# Patient Record
Sex: Male | Born: 1970 | Race: White | Hispanic: No | Marital: Single | Smoking: Former smoker
Health system: Southern US, Community
[De-identification: ages and names within clinical notes are randomized; demographics above are authoritative.]

## PROBLEM LIST (undated history)

## (undated) DIAGNOSIS — R7989 Other specified abnormal findings of blood chemistry: Secondary | ICD-10-CM

## (undated) DIAGNOSIS — F419 Anxiety disorder, unspecified: Secondary | ICD-10-CM

---

## 2004-05-10 ENCOUNTER — Emergency Department (HOSPITAL_COMMUNITY): Admission: EM | Admit: 2004-05-10 | Discharge: 2004-05-10 | Payer: Self-pay | Admitting: Emergency Medicine

## 2010-02-16 HISTORY — PX: CLAVICLE SURGERY: SHX598

## 2010-09-10 ENCOUNTER — Ambulatory Visit (HOSPITAL_COMMUNITY)
Admission: RE | Admit: 2010-09-10 | Discharge: 2010-09-10 | Disposition: A | Discharge status: Home / Self Care | Payer: Self-pay | Source: Ambulatory Visit | Attending: Orthopedic Surgery | Admitting: Orthopedic Surgery

## 2010-09-10 ENCOUNTER — Ambulatory Visit (HOSPITAL_COMMUNITY): Payer: Self-pay

## 2010-09-10 DIAGNOSIS — K219 Gastro-esophageal reflux disease without esophagitis: Secondary | ICD-10-CM | POA: Insufficient documentation

## 2010-09-10 DIAGNOSIS — S42023A Displaced fracture of shaft of unspecified clavicle, initial encounter for closed fracture: Secondary | ICD-10-CM | POA: Insufficient documentation

## 2010-09-10 LAB — DIFFERENTIAL
Basophils Absolute: 0 10*3/uL (ref 0.0–0.1)
Basophils Relative: 0 % (ref 0–1)
Eosinophils Absolute: 0.2 10*3/uL (ref 0.0–0.7)
Eosinophils Relative: 3 % (ref 0–5)
Monocytes Absolute: 0.5 10*3/uL (ref 0.1–1.0)

## 2010-09-10 LAB — SURGICAL PCR SCREEN
MRSA, PCR: NEGATIVE
Staphylococcus aureus: NEGATIVE

## 2010-09-10 LAB — ABO/RH: ABO/RH(D): A NEG

## 2010-09-10 LAB — BASIC METABOLIC PANEL
BUN: 15 mg/dL (ref 6–23)
GFR calc non Af Amer: >60 mL/min (ref >60)
Glucose, Bld: 100 mg/dL — ABNORMAL HIGH (ref 70–99)
Potassium: 4.2 mEq/L (ref 3.5–5.1)

## 2010-09-10 LAB — URINALYSIS, ROUTINE W REFLEX MICROSCOPIC
Bilirubin Urine: NEGATIVE
Hgb urine dipstick: NEGATIVE
Protein, ur: NEGATIVE mg/dL
Specific Gravity, Urine: 1.024 (ref 1.005–1.030)
Urobilinogen, UA: 0.2 mg/dL (ref 0.0–1.0)

## 2010-09-10 LAB — CBC
MCHC: 35 g/dL (ref 30.0–36.0)
RDW: 13.1 % (ref 11.5–15.5)

## 2010-09-10 LAB — PROTIME-INR
INR: 0.92 (ref 0.00–1.49)
Prothrombin Time: 12.6 seconds (ref 11.6–15.2)

## 2010-09-10 LAB — TYPE AND SCREEN
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
## 2011-01-26 ENCOUNTER — Ambulatory Visit: Payer: Self-pay

## 2011-01-26 DIAGNOSIS — J029 Acute pharyngitis, unspecified: Secondary | ICD-10-CM

## 2011-05-19 ENCOUNTER — Ambulatory Visit (INDEPENDENT_AMBULATORY_CARE_PROVIDER_SITE_OTHER): Payer: BC Managed Care – PPO | Admitting: Family Medicine

## 2011-05-19 ENCOUNTER — Ambulatory Visit: Payer: BC Managed Care – PPO

## 2011-05-19 VITALS — BP 129/76 | HR 73 | Temp 98.5°F | Resp 16 | Ht 74.0 in | Wt 230.2 lb

## 2011-05-19 DIAGNOSIS — R109 Unspecified abdominal pain: Secondary | ICD-10-CM

## 2011-05-19 DIAGNOSIS — R8281 Pyuria: Secondary | ICD-10-CM

## 2011-05-19 DIAGNOSIS — R339 Retention of urine, unspecified: Secondary | ICD-10-CM

## 2011-05-19 DIAGNOSIS — N390 Urinary tract infection, site not specified: Secondary | ICD-10-CM

## 2011-05-19 DIAGNOSIS — K760 Fatty (change of) liver, not elsewhere classified: Secondary | ICD-10-CM | POA: Insufficient documentation

## 2011-05-19 LAB — POCT URINALYSIS DIPSTICK
Bilirubin, UA: NEGATIVE
Nitrite, UA: NEGATIVE
Protein, UA: NEGATIVE
Urobilinogen, UA: 0.2
pH, UA: 6

## 2011-05-19 LAB — POCT UA - MICROSCOPIC ONLY
Casts, Ur, LPF, POC: NEGATIVE
Yeast, UA: NEGATIVE

## 2011-05-19 MED ORDER — DOXYCYCLINE HYCLATE 100 MG PO TABS: 100.0000 mg | ORAL_TABLET | Freq: Two times a day (BID) | ORAL | Status: AC

## 2011-05-19 MED ORDER — METAXALONE 800 MG PO TABS: 800.0000 mg | ORAL_TABLET | Freq: Three times a day (TID) | ORAL | Status: AC

## 2011-05-19 MED ORDER — CEFTRIAXONE SODIUM 1 G IJ SOLR
250.0000 mg | INTRAMUSCULAR | Status: DC
  Administered 2011-05-19: 250 mg via INTRAMUSCULAR

## 2011-05-19 MED ORDER — NAPROXEN 500 MG PO TABS: 500.0000 mg | ORAL_TABLET | Freq: Two times a day (BID) | ORAL | Status: AC

## 2011-05-19 NOTE — Progress Notes (Signed)
  Subjective:    Patient ID: Patrick Oconnell, male    DOB: 1970-05-08, 41 y.o.   MRN: [id number]  HPI    Review of Systems     Objective:   Physical Exam   [facility] reading (PRIMARY) by  Dr. Milus Glazier as NAD.       Assessment & Plan:

## 2011-05-19 NOTE — Progress Notes (Signed)
  Subjective:    Patient ID: Patrick Oconnell, male    DOB: 03-Apr-1970, 41 y.o.   MRN: [id number]  HPI 41 yo CM c/o R flank and lower back pain.  NKI.  Thinks he may have had kidney problems as an infant, but unsure of what.  Usu takes aleve for pain, but it isn't helping at all.  No dysuria or frequency, but he does often feel as tho he has to urinate again shortly after urinating. 1 partner in the last 6 months, they broke up ~1 month ago.  Denies penile discharge.  Review of Systems  All other systems reviewed and are negative.       Objective:   Physical Exam  Nursing note and vitals reviewed. Constitutional: He is oriented to person, place, and time. He appears well-developed and well-nourished.  HENT:  Head: Normocephalic and atraumatic.  Neck: Normal range of motion. Neck supple.  Cardiovascular: Normal rate, regular rhythm and normal heart sounds.   Pulmonary/Chest: Effort normal and breath sounds normal.  Musculoskeletal: Normal range of motion. He exhibits no tenderness.       The area of pain is just overlying the location of the R kidney.  No rash, non tender w/ palpation. Neg SLR B sitting Gait and ambulation are normal.  Neurological: He is alert and oriented to person, place, and time. No cranial nerve deficit.       DTRs of B lower extremity with almost no response even with augmenting.  No babinski.  Skin: Skin is warm and dry.  Psychiatric: He has a normal mood and affect. His behavior is normal.   Results for orders placed in visit on 05/19/11  POCT URINALYSIS DIPSTICK      Component Value Range   Color, UA yellow     Clarity, UA clear     Glucose, UA neg     Bilirubin, UA neg     Ketones, UA neg     Spec Grav, UA 1.025     Blood, UA neg     pH, UA 6.0     Protein, UA neg     Urobilinogen, UA 0.2     Nitrite, UA neg     Leukocytes, UA small (1+)    POCT UA - MICROSCOPIC ONLY      Component Value Range   WBC, Ur, HPF, POC 10-15     RBC, urine,  microscopic neg     Bacteria, U Microscopic 1+     Mucus, UA neg     Epithelial cells, urine per micros 0-3     Crystals, Ur, HPF, POC neg     Casts, Ur, LPF, POC neg     Yeast, UA neg         Assessment & Plan:  Pyuria-Cover for GC/chlamydia. Culture and uriprobe sent. Flank pain-Naprosyn and skelaxin.  Increase water intake. Consider test of cure if + for STD

## 2011-05-20 LAB — GC/CHLAMYDIA PROBE AMP, URINE
Chlamydia, Swab/Urine, PCR: NEGATIVE
GC Probe Amp, Urine: NEGATIVE

## 2011-05-21 LAB — URINE CULTURE: Colony Count: NO GROWTH

## 2011-07-08 ENCOUNTER — Ambulatory Visit: Payer: BC Managed Care – PPO

## 2014-01-28 ENCOUNTER — Emergency Department (HOSPITAL_COMMUNITY): Payer: BC Managed Care – PPO

## 2014-01-28 ENCOUNTER — Encounter (HOSPITAL_COMMUNITY): Payer: Self-pay

## 2014-01-28 ENCOUNTER — Emergency Department (HOSPITAL_COMMUNITY)
Admission: EM | Admit: 2014-01-28 | Discharge: 2014-01-28 | Disposition: A | Discharge status: Home / Self Care | Payer: BC Managed Care – PPO | Attending: Emergency Medicine | Admitting: Emergency Medicine

## 2014-01-28 DIAGNOSIS — S93402A Sprain of unspecified ligament of left ankle, initial encounter: Secondary | ICD-10-CM

## 2014-01-28 DIAGNOSIS — R52 Pain, unspecified: Secondary | ICD-10-CM

## 2014-01-28 DIAGNOSIS — S30811A Abrasion of abdominal wall, initial encounter: Secondary | ICD-10-CM | POA: Diagnosis not present

## 2014-01-28 DIAGNOSIS — Z87891 Personal history of nicotine dependence: Secondary | ICD-10-CM | POA: Diagnosis not present

## 2014-01-28 DIAGNOSIS — Y92410 Unspecified street and highway as the place of occurrence of the external cause: Secondary | ICD-10-CM | POA: Insufficient documentation

## 2014-01-28 DIAGNOSIS — S42032A Displaced fracture of lateral end of left clavicle, initial encounter for closed fracture: Secondary | ICD-10-CM | POA: Diagnosis not present

## 2014-01-28 DIAGNOSIS — T148XXA Other injury of unspecified body region, initial encounter: Secondary | ICD-10-CM

## 2014-01-28 DIAGNOSIS — S4992XA Unspecified injury of left shoulder and upper arm, initial encounter: Secondary | ICD-10-CM | POA: Diagnosis present

## 2014-01-28 DIAGNOSIS — S80212A Abrasion, left knee, initial encounter: Secondary | ICD-10-CM | POA: Insufficient documentation

## 2014-01-28 DIAGNOSIS — S90512A Abrasion, left ankle, initial encounter: Secondary | ICD-10-CM | POA: Diagnosis not present

## 2014-01-28 DIAGNOSIS — Y9389 Activity, other specified: Secondary | ICD-10-CM | POA: Diagnosis not present

## 2014-01-28 DIAGNOSIS — Y998 Other external cause status: Secondary | ICD-10-CM | POA: Insufficient documentation

## 2014-01-28 DIAGNOSIS — S42002A Fracture of unspecified part of left clavicle, initial encounter for closed fracture: Secondary | ICD-10-CM

## 2014-01-28 DIAGNOSIS — S2232XA Fracture of one rib, left side, initial encounter for closed fracture: Secondary | ICD-10-CM | POA: Diagnosis not present

## 2014-01-28 MED ORDER — HYDROCODONE-ACETAMINOPHEN 5-325 MG PO TABS
1.0000 | ORAL_TABLET | Freq: Once | ORAL | Status: AC
  Administered 2014-01-28: 1 via ORAL
  Filled 2014-01-28: qty 1

## 2014-01-28 MED ORDER — HYDROCODONE-ACETAMINOPHEN 5-325 MG PO TABS: 1.0000 | ORAL_TABLET | Freq: Four times a day (QID) | ORAL | Status: DC | PRN
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
## 2014-01-28 NOTE — ED Notes (Signed)
Pt p

## 2014-01-28 NOTE — Discharge Instructions (Signed)
Abrasion An abrasion is a cut or scrape of the skin. Abrasions do not extend through all layers of the skin and most heal within 10 days. It is important to care for your abrasion properly to prevent infection. CAUSES  Most abrasions are caused by falling on, or gliding across, the ground or other surface. When your skin rubs on something, the outer and inner layer of skin rubs off, causing an abrasion. DIAGNOSIS  Your caregiver will be able to diagnose an abrasion during a physical exam.  TREATMENT  Your treatment depends on how large and deep the abrasion is. Generally, your abrasion will be cleaned with water and a mild soap to remove any dirt or debris. An antibiotic ointment may be put over the abrasion to prevent an infection. A bandage (dressing) may be wrapped around the abrasion to keep it from getting dirty.  You may need a tetanus shot if:  You cannot remember when you had your last tetanus shot.  You have never had a tetanus shot.  The injury broke your skin. If you get a tetanus shot, your arm may swell, get red, and feel warm to the touch. This is common and not a problem. If you need a tetanus shot and you choose not to have one, there is a rare chance of getting tetanus. Sickness from tetanus can be serious.  HOME CARE INSTRUCTIONS   If a dressing was applied, change it at least once a day or as directed by your caregiver. If the bandage sticks, soak it off with warm water.   Wash the area with water and a mild soap to remove all the ointment 2 times a day. Rinse off the soap and pat the area dry with a clean towel.   Reapply any ointment as directed by your caregiver. This will help prevent infection and keep the bandage from sticking. Use gauze over the wound and under the dressing to help keep the bandage from sticking.   Change your dressing right away if it becomes wet or dirty.   Only take over-the-counter or prescription medicines for pain, discomfort, or fever as  directed by your caregiver.   Follow up with your caregiver within 24-48 hours for a wound check, or as directed. If you were not given a wound-check appointment, look closely at your abrasion for redness, swelling, or pus. These are signs of infection. SEEK IMMEDIATE MEDICAL CARE IF:   You have increasing pain in the wound.   You have redness, swelling, or tenderness around the wound.   You have pus coming from the wound.   You have a fever or persistent symptoms for more than 2-3 days.  You have a fever and your symptoms suddenly get worse.  You have a bad smell coming from the wound or dressing.  MAKE SURE YOU:   Understand these instructions.  Will watch your condition.  Will get help right away if you are not doing well or get worse. Document Released: 11/12/2004 Document Revised: 01/20/2012 Document Reviewed: 01/06/2011 [facility] Patient Information 2015 [place], [place]. This information is not intended to replace advice given to you by your health care provider. Make sure you discuss any questions you have with your health care provider.  Bicycling, Adult Cyclists  Whether motivated by recreation or transportation, more adults are taking up cycling than ever before. Learning more about cycling greatly increases confidence. And it can be a great aid in learning to share the road more effectively.  If you are  using your bicycle in different situations than you previously have, such as switching from occasional short recreational rides to regularly commuting to work, you may want to take a short workshop. Begin by assessing yourself: How confident are you in your cycling skills? What would you like to know more about? Are there particular kinds of cycling you would like to try out? Courses and workshops may focus on learning to race, long distance touring, teaching children to cycle safely, commuting, or bike repairs. With that in mind, adult cyclists may wish to check around  their community for bike clubs, classes, rides, and other cycling opportunities. Check with the League of [employer] Bicyclists (www.bikeleague.org) for a listing of instructional opportunities available in your area.  Brush up on riding skills and rules if it has been a while since you cycled regularly.  Adult cyclists who wish to cycle with small children, and cyclists needing to transport cargo, should investigate the various child seats and trailers available. Determine which are the safest and which will work best for you.  Adult cyclists should learn more about off-road cycling, touring, and racing before participating in these activities. Adult cyclists are encouraged to try cycling on multi-use paths. Remember to respect others' needs on the trails.  Do not underestimate the importance of wearing a helmet. Accidents can happen anywhere. Cyclists should always wear a helmet.  Adult cyclists should learn how to handle harassment from motorists and others in traffic. It is in your best interest not to return any harassment or insults.  Just like a car, a bicycle requires basic maintenance to keep running smoothly and safely. Bikes are easy to work on and you can save money by learning bike maintenance. This can be done by picking up a manual and taking a repair course. Those who really do not have time should keep their bicycles regularly serviced at a good bike shop.  Bicycling can fit into one's everyday life. Substitute a bike ride for a car trip. Adult cyclists should know the health and environmental benefits of bicycling. Document Released: 04/25/2003 Document Revised: 06/19/2013 Document Reviewed: 01/30/2008 [facility] Patient Information 2015 [place], [place]. This information is not intended to replace advice given to you by your health care provider. Make sure you discuss any questions you have with your health care provider.  Clavicle Fracture A clavicle fracture is a broken collarbone.  The collarbone is the long bone that connects your shoulder to your rib cage. A broken collarbone may be treated with a sling, a wrap, or surgery. Treatment depends on whether the broken ends of the bone are out of place or not. HOME CARE  Put ice on the injured area:  Put ice in a plastic bag.  Place a towel between your skin and the bag.  Leave the ice on for 20 minutes, 2-3 times a day.  If you have a wrap or splint:  Wear it all the time, and remove it only to take a bath or shower.  When you bathe or shower, keep your shoulder in the same place as when the sling or wrap is on.  Do not lift your arm.  If you have a wrap:  Another person must tighten it every day.  It should be tight enough to hold your shoulders back.  Make sure you have enough room to put your pointer finger between your body and the strap.  Loosen the wrap right away if you cannot feel your arm or your hands tingle.  Only take medicines as told by your doctor.  Avoid activities that make the injury or pain worse for 4-6 weeks after surgery.  Keep all follow-up appointments. GET HELP IF:  Your medicine is not making you feel less pain.  Your medicine is not making swelling better. GET HELP RIGHT AWAY IF:   Your cannot feel your arm.  Your arm is cold.  Your arm is a lighter color than normal. MAKE SURE YOU:   Understand these instructions.  Will watch your condition.  Will get help right away if you are not doing well or get worse. Document Released: 07/22/2007 Document Revised: 02/07/2013 Document Reviewed: 11/20/2008 [facility] Patient Information 2015 [place]. This information is not intended to replace advice given to you by your health care provider. Make sure you discuss any questions you have with your health care provider.  Rib Fracture A rib fracture is a break or crack in one of the bones of the ribs. The ribs are a group of long, curved bones that wrap around your chest  and attach to your spine. They protect your lungs and other organs in the chest cavity. A broken or cracked rib is often painful, but most do not cause other problems. Most rib fractures heal on their own over time. However, rib fractures can be more serious if multiple ribs are broken or if broken ribs move out of place and push against other structures. CAUSES   A direct blow to the chest. For example, this could happen during contact sports, a car accident, or a fall against a hard object.  Repetitive movements with high force, such as pitching a baseball or having severe coughing spells. SYMPTOMS   Pain when you breathe in or cough.  Pain when someone presses on the injured area. DIAGNOSIS  Your caregiver will perform a physical exam. Various imaging tests may be ordered to confirm the diagnosis and to look for related injuries. These tests may include a chest X-ray, computed tomography (CT), magnetic resonance imaging (MRI), or a bone scan. TREATMENT  Rib fractures usually heal on their own in 1-3 months. The longer healing period is often associated with a continued cough or other aggravating activities. During the healing period, pain control is very important. Medication is usually given to control pain. Hospitalization or surgery may be needed for more severe injuries, such as those in which multiple ribs are broken or the ribs have moved out of place.  HOME CARE INSTRUCTIONS   Avoid strenuous activity and any activities or movements that cause pain. Be careful during activities and avoid bumping the injured rib.  Gradually increase activity as directed by your caregiver.  Only take over-the-counter or prescription medications as directed by your caregiver. Do not take other medications without asking your caregiver first.  Apply ice to the injured area for the first 1-2 days after you have been treated or as directed by your caregiver. Applying ice helps to reduce inflammation and  pain.  Put ice in a plastic bag.  Place a towel between your skin and the bag.   Leave the ice on for 15-20 minutes at a time, every 2 hours while you are awake.  Perform deep breathing as directed by your caregiver. This will help prevent pneumonia, which is a common complication of a broken rib. Your caregiver may instruct you to:  Take deep breaths several times a day.  Try to cough several times a day, holding a pillow against the injured area.  Use a device called an incentive spirometer to practice deep breathing several times a day.  Drink enough fluids to keep your urine clear or pale yellow. This will help you avoid constipation.   Do not wear a rib belt or binder. These restrict breathing, which can lead to pneumonia.  SEEK IMMEDIATE MEDICAL CARE IF:   You have a fever.   You have difficulty breathing or shortness of breath.   You develop a continual cough, or you cough up thick or bloody sputum.  You feel sick to your stomach (nausea), throw up (vomit), or have abdominal pain.   You have worsening pain not controlled with medications.  MAKE SURE YOU:  Understand these instructions.  Will watch your condition.  Will get help right away if you are not doing well or get worse. Document Released: 02/02/2005 Document Revised: 10/05/2012 Document Reviewed: 04/06/2012 [facility] Patient Information 2015 [place], [place]. This information is not intended to replace advice given to you by your health care provider. Make sure you discuss any questions you have with your health care provider.

## 2014-01-31 ENCOUNTER — Encounter (HOSPITAL_BASED_OUTPATIENT_CLINIC_OR_DEPARTMENT_OTHER): Payer: Self-pay | Admitting: *Deleted

## 2014-01-31 NOTE — H&P (Signed)
      ORTHOPAEDIC CONSULTATION  REQUESTING PHYSICIAN: Renette Butters, MD  Chief Complaint: Left Clavicle fracture   HPI: Patrick Oconnell is a 43 y.o. male who complains of  pain  Past Medical History  Diagnosis Date  . Low testosterone    Past Surgical History  Procedure Laterality Date  . Clavicle surgery  2012    fx right clavical   History   Social History  . Marital Status: Single    Spouse Name: N/A    Number of Children: N/A  . Years of Education: N/A   Social History Main Topics  . Smoking status: Former Smoker    Quit date: 02/01/1999  . Smokeless tobacco: None  . Alcohol Use: Yes     Comment: socially   . Drug Use: Yes    Special: Marijuana  . Sexual Activity: None   Other Topics Concern  . None   Social History Narrative   History reviewed. No pertinent family history. No Known Allergies Prior to Admission medications   Medication Sig Start Date End Date Taking? Authorizing Provider  clomiPHENE (CLOMID) 50 MG tablet Take 1 tablet by mouth daily. 12/09/13   Historical Provider, MD  HYDROcodone-acetaminophen (NORCO/VICODIN) 5-325 MG per tablet Take 1-2 tablets by mouth every 6 (six) hours as needed. 01/28/14   Artis Delay, MD   No results found.  Positive ROS: All other systems have been reviewed and were otherwise negative with the exception of those mentioned in the HPI and as above.  Labs cbc No results for input(s): WBC, HGB, HCT, PLT in the last 72 hours.  Labs inflam No results for input(s): CRP in the last 72 hours.  Invalid input(s): ESR  Labs coag No results for input(s): INR, PTT in the last 72 hours.  Invalid input(s): PT  No results for input(s): NA, K, CL, CO2, GLUCOSE, BUN, CREATININE, CALCIUM in the last 72 hours.  Physical Exam: There were no vitals filed for this visit. General: Alert, no acute distress Cardiovascular: No pedal edema Respiratory: No cyanosis, no use of accessory musculature GI: No organomegaly,  abdomen is soft and non-tender Skin: No lesions in the area of chief complaint other than those listed below in MSK exam.  Neurologic: Sensation intact distally Psychiatric: Patient is competent for consent with normal mood and affect Lymphatic: No axillary or cervical lymphadenopathy  MUSCULOSKELETAL:  LUE: skin benigne Other extremities are atraumatic with painless ROM and NVI.  Assessment: Left clavicle fracture  Plan: ORIF or left clavicle    Edmonia Lynch, D, MD Cell [phone]   01/31/2014 5:35 PM

## 2014-02-01 ENCOUNTER — Ambulatory Visit (HOSPITAL_BASED_OUTPATIENT_CLINIC_OR_DEPARTMENT_OTHER): Payer: BC Managed Care – PPO | Admitting: Anesthesiology

## 2014-02-01 ENCOUNTER — Encounter (HOSPITAL_COMMUNITY)
Admission: RE | Disposition: A | Discharge status: Home / Self Care | Payer: Self-pay | Source: Ambulatory Visit | Attending: Orthopedic Surgery

## 2014-02-01 ENCOUNTER — Encounter (HOSPITAL_BASED_OUTPATIENT_CLINIC_OR_DEPARTMENT_OTHER): Payer: Self-pay

## 2014-02-01 ENCOUNTER — Ambulatory Visit (HOSPITAL_BASED_OUTPATIENT_CLINIC_OR_DEPARTMENT_OTHER)
Admission: RE | Admit: 2014-02-01 | Discharge: 2014-02-01 | Disposition: A | Discharge status: Home / Self Care | Payer: BC Managed Care – PPO | Source: Ambulatory Visit | Attending: Orthopedic Surgery | Admitting: Orthopedic Surgery

## 2014-02-01 ENCOUNTER — Ambulatory Visit (HOSPITAL_COMMUNITY): Payer: BC Managed Care – PPO

## 2014-02-01 DIAGNOSIS — Z87891 Personal history of nicotine dependence: Secondary | ICD-10-CM | POA: Diagnosis not present

## 2014-02-01 DIAGNOSIS — X58XXXA Exposure to other specified factors, initial encounter: Secondary | ICD-10-CM | POA: Diagnosis not present

## 2014-02-01 DIAGNOSIS — Z419 Encounter for procedure for purposes other than remedying health state, unspecified: Secondary | ICD-10-CM

## 2014-02-01 DIAGNOSIS — Y999 Unspecified external cause status: Secondary | ICD-10-CM | POA: Diagnosis not present

## 2014-02-01 DIAGNOSIS — Y939 Activity, unspecified: Secondary | ICD-10-CM | POA: Insufficient documentation

## 2014-02-01 DIAGNOSIS — Y929 Unspecified place or not applicable: Secondary | ICD-10-CM | POA: Diagnosis not present

## 2014-02-01 DIAGNOSIS — S42002A Fracture of unspecified part of left clavicle, initial encounter for closed fracture: Secondary | ICD-10-CM | POA: Insufficient documentation

## 2014-02-01 HISTORY — DX: Other specified abnormal findings of blood chemistry: R79.89

## 2014-02-01 HISTORY — PX: ORIF CLAVICULAR FRACTURE: SHX5055

## 2014-02-01 LAB — POCT HEMOGLOBIN-HEMACUE: HEMOGLOBIN: 14.5 g/dL (ref 13.0–17.0)

## 2014-02-01 SURGERY — OPEN REDUCTION INTERNAL FIXATION (ORIF) CLAVICULAR FRACTURE
Anesthesia: General | Laterality: Left

## 2014-02-01 MED ORDER — FENTANYL CITRATE 0.05 MG/ML IJ SOLN
INTRAMUSCULAR | Status: AC
  Filled 2014-02-01: qty 2

## 2014-02-01 MED ORDER — LIDOCAINE HCL (CARDIAC) 20 MG/ML IV SOLN
INTRAVENOUS | Status: DC | PRN
  Administered 2014-02-01: 80 mg via INTRAVENOUS

## 2014-02-01 MED ORDER — DEXTROSE-NACL 5-0.45 % IV SOLN: 100.0000 mL/h | INTRAVENOUS | Status: DC

## 2014-02-01 MED ORDER — LIDOCAINE HCL (CARDIAC) 20 MG/ML IV SOLN
INTRAVENOUS | Status: AC
  Filled 2014-02-01: qty 5

## 2014-02-01 MED ORDER — MIDAZOLAM HCL 5 MG/5ML IJ SOLN
INTRAMUSCULAR | Status: DC | PRN
  Administered 2014-02-01 (×2): 1 mg via INTRAVENOUS

## 2014-02-01 MED ORDER — OXYCODONE-ACETAMINOPHEN 5-325 MG PO TABS: 1.0000 | ORAL_TABLET | ORAL | Status: DC | PRN

## 2014-02-01 MED ORDER — CEFAZOLIN SODIUM-DEXTROSE 2-3 GM-% IV SOLR
INTRAVENOUS | Status: DC | PRN
  Administered 2014-02-01: 2 g via INTRAVENOUS

## 2014-02-01 MED ORDER — MIDAZOLAM HCL 2 MG/2ML IJ SOLN
INTRAMUSCULAR | Status: AC
  Filled 2014-02-01: qty 2

## 2014-02-01 MED ORDER — ONDANSETRON HCL 4 MG/2ML IJ SOLN
INTRAMUSCULAR | Status: DC | PRN
  Administered 2014-02-01: 4 mg via INTRAVENOUS

## 2014-02-01 MED ORDER — HYDROMORPHONE HCL 1 MG/ML IJ SOLN
INTRAMUSCULAR | Status: AC
  Filled 2014-02-01: qty 1

## 2014-02-01 MED ORDER — PROPOFOL 10 MG/ML IV BOLUS
INTRAVENOUS | Status: DC | PRN
  Administered 2014-02-01: 270 mg via INTRAVENOUS

## 2014-02-01 MED ORDER — LACTATED RINGERS IV SOLN
INTRAVENOUS | Status: DC
  Administered 2014-02-01 (×2): via INTRAVENOUS

## 2014-02-01 MED ORDER — NEOSTIGMINE METHYLSULFATE 10 MG/10ML IV SOLN
INTRAVENOUS | Status: DC | PRN
  Administered 2014-02-01: 3 mg via INTRAVENOUS

## 2014-02-01 MED ORDER — ROCURONIUM BROMIDE 50 MG/5ML IV SOLN
INTRAVENOUS | Status: AC
  Filled 2014-02-01: qty 1

## 2014-02-01 MED ORDER — MIDAZOLAM HCL 2 MG/2ML IJ SOLN: 1.0000 mg | INTRAMUSCULAR | Status: DC | PRN

## 2014-02-01 MED ORDER — 0.9 % SODIUM CHLORIDE (POUR BTL) OPTIME
TOPICAL | Status: DC | PRN
  Administered 2014-02-01: 1000 mL

## 2014-02-01 MED ORDER — MIDAZOLAM HCL 2 MG/2ML IJ SOLN
INTRAMUSCULAR | Status: AC
  Administered 2014-02-01: 1 mg
  Filled 2014-02-01: qty 2

## 2014-02-01 MED ORDER — FENTANYL CITRATE 0.05 MG/ML IJ SOLN
INTRAMUSCULAR | Status: DC | PRN
  Administered 2014-02-01 (×5): 50 ug via INTRAVENOUS

## 2014-02-01 MED ORDER — ACETAMINOPHEN 500 MG PO TABS
1000.0000 mg | ORAL_TABLET | Freq: Once | ORAL | Status: AC
  Administered 2014-02-01: 1000 mg via ORAL

## 2014-02-01 MED ORDER — OXYCODONE HCL 5 MG PO TABS
5.0000 mg | ORAL_TABLET | ORAL | Status: DC | PRN
  Administered 2014-02-01: 5 mg via ORAL

## 2014-02-01 MED ORDER — LIDOCAINE HCL 4 % MT SOLN
OROMUCOSAL | Status: DC | PRN
  Administered 2014-02-01: 4 mL via TOPICAL

## 2014-02-01 MED ORDER — FENTANYL CITRATE 0.05 MG/ML IJ SOLN
INTRAMUSCULAR | Status: AC
  Administered 2014-02-01: 50 ug
  Filled 2014-02-01: qty 2

## 2014-02-01 MED ORDER — ACETAMINOPHEN 500 MG PO TABS
ORAL_TABLET | ORAL | Status: AC
  Filled 2014-02-01: qty 2

## 2014-02-01 MED ORDER — HYDROMORPHONE HCL 1 MG/ML IJ SOLN
INTRAMUSCULAR | Status: AC
  Administered 2014-02-01: 0.5 mg via INTRAVENOUS
  Filled 2014-02-01: qty 1

## 2014-02-01 MED ORDER — ROCURONIUM BROMIDE 100 MG/10ML IV SOLN
INTRAVENOUS | Status: DC | PRN
  Administered 2014-02-01: 40 mg via INTRAVENOUS

## 2014-02-01 MED ORDER — MORPHINE SULFATE 10 MG/ML IJ SOLN
INTRAMUSCULAR | Status: AC
  Filled 2014-02-01: qty 1

## 2014-02-01 MED ORDER — FENTANYL CITRATE 0.05 MG/ML IJ SOLN: 50.0000 ug | INTRAMUSCULAR | Status: DC | PRN

## 2014-02-01 MED ORDER — OXYCODONE HCL 5 MG PO TABS
ORAL_TABLET | ORAL | Status: AC
  Administered 2014-02-01: 5 mg via ORAL
  Filled 2014-02-01: qty 1

## 2014-02-01 MED ORDER — GLYCOPYRROLATE 0.2 MG/ML IJ SOLN
INTRAMUSCULAR | Status: DC | PRN
  Administered 2014-02-01: 0.4 mg via INTRAVENOUS

## 2014-02-01 MED ORDER — ONDANSETRON HCL 4 MG PO TABS: 4.0000 mg | ORAL_TABLET | Freq: Three times a day (TID) | ORAL | Status: DC | PRN

## 2014-02-01 MED ORDER — LACTATED RINGERS IV SOLN: INTRAVENOUS | Status: DC

## 2014-02-01 MED ORDER — ONDANSETRON HCL 4 MG/2ML IJ SOLN
INTRAMUSCULAR | Status: AC
  Filled 2014-02-01: qty 2

## 2014-02-01 MED ORDER — FENTANYL CITRATE 0.05 MG/ML IJ SOLN
INTRAMUSCULAR | Status: AC
  Filled 2014-02-01: qty 5

## 2014-02-01 MED ORDER — CEFAZOLIN SODIUM-DEXTROSE 2-3 GM-% IV SOLR
INTRAVENOUS | Status: AC
  Filled 2014-02-01: qty 50

## 2014-02-01 MED ORDER — BUPIVACAINE HCL (PF) 0.25 % IJ SOLN
INTRAMUSCULAR | Status: DC | PRN
  Administered 2014-02-01: 10 mL

## 2014-02-01 MED ORDER — PROPOFOL 10 MG/ML IV BOLUS
INTRAVENOUS | Status: AC
  Filled 2014-02-01: qty 20

## 2014-02-01 MED ORDER — BUPIVACAINE HCL (PF) 0.25 % IJ SOLN
INTRAMUSCULAR | Status: AC
  Filled 2014-02-01: qty 30

## 2014-02-01 MED ORDER — HYDROMORPHONE HCL 1 MG/ML IJ SOLN
0.2500 mg | INTRAMUSCULAR | Status: DC | PRN
  Administered 2014-02-01 (×4): 0.5 mg via INTRAVENOUS

## 2014-02-01 MED ORDER — ONDANSETRON HCL 4 MG/2ML IJ SOLN
INTRAMUSCULAR | Status: AC
  Administered 2014-02-01: 4 mg via INTRAVENOUS
  Filled 2014-02-01: qty 2

## 2014-02-01 MED ORDER — PHENYLEPHRINE HCL 10 MG/ML IJ SOLN
INTRAMUSCULAR | Status: DC | PRN
  Administered 2014-02-01 (×2): 80 ug via INTRAVENOUS

## 2014-02-01 MED ORDER — CEFAZOLIN SODIUM-DEXTROSE 2-3 GM-% IV SOLR: 2.0000 g | INTRAVENOUS | Status: DC

## 2014-02-01 MED ORDER — ONDANSETRON HCL 4 MG/2ML IJ SOLN
4.0000 mg | Freq: Once | INTRAMUSCULAR | Status: AC | PRN
  Administered 2014-02-01: 4 mg via INTRAVENOUS

## 2014-02-01 SURGICAL SUPPLY — 47 items
BLADE CLIPPER SURG (BLADE) IMPLANT
BLADE SURG 15 STRL LF DISP TIS (BLADE) ×1 IMPLANT
BLADE SURG 15 STRL SS (BLADE) ×2
CHLORAPREP W/TINT 26ML (MISCELLANEOUS) ×2 IMPLANT
CLSR STERI-STRIP ANTIMIC 1/2X4 (GAUZE/BANDAGES/DRESSINGS) IMPLANT
DECANTER SPIKE VIAL GLASS SM (MISCELLANEOUS) IMPLANT
DRAPE OEC MINIVIEW 54X84 (DRAPES) ×2 IMPLANT
DRAPE U-SHAPE 47X51 STRL (DRAPES) ×2 IMPLANT
DRAPE U-SHAPE 76X120 STRL (DRAPES) ×4 IMPLANT
ELECT REM PT RETURN 9FT ADLT (ELECTROSURGICAL) ×2
ELECTRODE REM PT RTRN 9FT ADLT (ELECTROSURGICAL) ×1 IMPLANT
GAUZE SPONGE 4X4 12PLY STRL (GAUZE/BANDAGES/DRESSINGS) ×2 IMPLANT
GAUZE XEROFORM 1X8 LF (GAUZE/BANDAGES/DRESSINGS) ×2 IMPLANT
GLOVE BIO SURGEON STRL SZ7.5 (GLOVE) ×2 IMPLANT
GLOVE BIO SURGEON STRL SZ8 (GLOVE) ×2 IMPLANT
GLOVE BIOGEL PI IND STRL 8 (GLOVE) ×2 IMPLANT
GLOVE BIOGEL PI INDICATOR 8 (GLOVE) ×2
GOWN STRL REUS W/ TWL LRG LVL3 (GOWN DISPOSABLE) ×1 IMPLANT
GOWN STRL REUS W/ TWL XL LVL3 (GOWN DISPOSABLE) ×2 IMPLANT
GOWN STRL REUS W/TWL LRG LVL3 (GOWN DISPOSABLE) ×2
GOWN STRL REUS W/TWL XL LVL3 (GOWN DISPOSABLE) ×4
NS IRRIG 1000ML POUR BTL (IV SOLUTION) ×2 IMPLANT
PACK ARTHROSCOPY DSU (CUSTOM PROCEDURE TRAY) ×2 IMPLANT
PACK BASIN DAY SURGERY FS (CUSTOM PROCEDURE TRAY) ×2 IMPLANT
PENCIL BUTTON HOLSTER BLD 10FT (ELECTRODE) ×2 IMPLANT
SLEEVE SCD COMPRESS KNEE MED (MISCELLANEOUS) ×2 IMPLANT
SLING ARM IMMOBILIZER LRG (SOFTGOODS) IMPLANT
SLING ARM IMMOBILIZER MED (SOFTGOODS) IMPLANT
SLING ARM LRG ADULT FOAM STRAP (SOFTGOODS) IMPLANT
SLING ARM MED ADULT FOAM STRAP (SOFTGOODS) IMPLANT
SLING ARM XL FOAM STRAP (SOFTGOODS) IMPLANT
SPONGE LAP 18X18 X RAY DECT (DISPOSABLE) ×2 IMPLANT
STAPLER VISISTAT 35W (STAPLE) IMPLANT
SUCTION FRAZIER TIP 10 FR DISP (SUCTIONS) IMPLANT
SUT ETHILON 3 0 PS 1 (SUTURE) IMPLANT
SUT FIBERWIRE #2 38 T-5 BLUE (SUTURE)
SUT VIC AB 0 CT1 27 (SUTURE)
SUT VIC AB 0 CT1 27XBRD ANBCTR (SUTURE) IMPLANT
SUT VIC AB 2-0 SH 27 (SUTURE)
SUT VIC AB 2-0 SH 27XBRD (SUTURE) IMPLANT
SUT VIC AB 3-0 SH 27 (SUTURE)
SUT VIC AB 3-0 SH 27X BRD (SUTURE) IMPLANT
SUTURE FIBERWR #2 38 T-5 BLUE (SUTURE) IMPLANT
SYR BULB 3OZ (MISCELLANEOUS) ×2 IMPLANT
TOWEL OR 17X24 6PK STRL BLUE (TOWEL DISPOSABLE) ×2 IMPLANT
TOWEL OR NON WOVEN STRL DISP B (DISPOSABLE) ×2 IMPLANT
YANKAUER SUCT BULB TIP NO VENT (SUCTIONS) ×2 IMPLANT

## 2014-02-01 SURGICAL SUPPLY — 57 items
2.6MM X 220MM LONG DRILL BIT ×1 IMPLANT
BIT DRILL 2.6 (BIT) ×1 IMPLANT
BIT DRILL LONG 2.6X220 (BIT) ×2 IMPLANT
BIT DRILL Q COUPLING 4.5 (BIT) IMPLANT
BIT DRILL Q/COUPLING 1 (BIT) IMPLANT
CATH URET WHISTLE 8FR 331008 (CATHETERS) ×2 IMPLANT
CLEANER TIP ELECTROSURG 2X2 (MISCELLANEOUS) ×2 IMPLANT
DRAPE C-ARM 42X72 X-RAY (DRAPES) ×2 IMPLANT
DRAPE IMP U-DRAPE 54X76 (DRAPES) ×2 IMPLANT
DRAPE ORTHO SPLIT 77X108 STRL (DRAPES) ×2
DRAPE PROXIMA HALF (DRAPES) ×1 IMPLANT
DRAPE SURG ORHT 6 SPLT 77X108 (DRAPES) IMPLANT
DRAPE U-SHAPE 47X51 STRL (DRAPES) ×4 IMPLANT
DRSG EMULSION OIL 3X3 NADH (GAUZE/BANDAGES/DRESSINGS) IMPLANT
DRSG MEPILEX BORDER 4X8 (GAUZE/BANDAGES/DRESSINGS) ×2 IMPLANT
DURAPREP 26ML APPLICATOR (WOUND CARE) ×2 IMPLANT
ELECT REM PT RETURN 9FT ADLT (ELECTROSURGICAL) ×2
ELECTRODE REM PT RTRN 9FT ADLT (ELECTROSURGICAL) ×1 IMPLANT
GAUZE SPONGE 4X4 12PLY STRL (GAUZE/BANDAGES/DRESSINGS) IMPLANT
GLOVE BIO SURGEON STRL SZ7.5 (GLOVE) ×2 IMPLANT
GLOVE BIOGEL PI IND STRL 8 (GLOVE) ×1 IMPLANT
GLOVE BIOGEL PI INDICATOR 8 (GLOVE) ×1
GOWN STRL REUS W/ TWL LRG LVL3 (GOWN DISPOSABLE) ×2 IMPLANT
GOWN STRL REUS W/TWL LRG LVL3 (GOWN DISPOSABLE) ×4
INSERTER BUTTON (SYSTAGENIX WOUND MANAGEMENT) ×1 IMPLANT
KIT BASIN OR (CUSTOM PROCEDURE TRAY) ×2 IMPLANT
KIT REPAIR AC JOINT TIGHT (Orthopedic Implant) ×1 IMPLANT
KIT ROOM TURNOVER OR (KITS) ×2 IMPLANT
MANIFOLD NEPTUNE II (INSTRUMENTS) ×2 IMPLANT
NDL HYPO 25GX1X1/2 BEV (NEEDLE) ×1 IMPLANT
NEEDLE HYPO 25GX1X1/2 BEV (NEEDLE) ×2 IMPLANT
NS IRRIG 1000ML POUR BTL (IV SOLUTION) ×2 IMPLANT
PACK SHOULDER (CUSTOM PROCEDURE TRAY) ×2 IMPLANT
PACK UNIVERSAL I (CUSTOM PROCEDURE TRAY) ×1 IMPLANT
PAD ARMBOARD 7.5X6 YLW CONV (MISCELLANEOUS) ×4 IMPLANT
PIN DRILL ACL TIGHTROPE 4MM (PIN) ×2 IMPLANT
PLATE 4H SUPERIOR LATERAL (Plate) ×1 IMPLANT
SCREW BONE 3.5X16MM (Screw) ×6 IMPLANT
SCREW LOCKING 14MM (Screw) ×6 IMPLANT
SCREW LOCKING 16MM (Screw) ×1 IMPLANT
SLING ARM IMMOBILIZER LRG (SOFTGOODS) ×1 IMPLANT
SLING ARM LRG ADULT FOAM STRAP (SOFTGOODS) ×2 IMPLANT
SPONGE LAP 4X18 X RAY DECT (DISPOSABLE) ×3 IMPLANT
STRIP CLOSURE SKIN 1/2X4 (GAUZE/BANDAGES/DRESSINGS) IMPLANT
SUCTION FRAZIER TIP 10 FR DISP (SUCTIONS) ×2 IMPLANT
SUT FIBERWIRE #2 38 REV NDL BL (SUTURE) ×2
SUT MNCRL AB 4-0 PS2 18 (SUTURE) ×2 IMPLANT
SUT MON AB 2-0 CT1 36 (SUTURE) ×2 IMPLANT
SUT VIC AB 0 CT1 27 (SUTURE) ×4
SUT VIC AB 0 CT1 27XBRD ANBCTR (SUTURE) IMPLANT
SUT VICRYL 0 CT 1 36IN (SUTURE) ×1 IMPLANT
SUTURE FIBERWR#2 38 REV NDL BL (SUTURE) IMPLANT
SYR CONTROL 10ML LL (SYRINGE) ×2 IMPLANT
TOWEL OR 17X24 6PK STRL BLUE (TOWEL DISPOSABLE) ×2 IMPLANT
TOWEL OR 17X26 10 PK STRL BLUE (TOWEL DISPOSABLE) ×2 IMPLANT
TOWEL OR NON WOVEN STRL DISP B (DISPOSABLE) ×1 IMPLANT
WATER STERILE IRR 1000ML POUR (IV SOLUTION) ×1 IMPLANT

## 2014-02-01 NOTE — Anesthesia Postprocedure Evaluation (Signed)
  Anesthesia Post-op Note  Patient: Patrick KurtzStephen V Oconnell  Procedure(s) Performed: Procedure(s): OPEN REDUCTION INTERNAL FIXATION (ORIF) LEFT CLAVICLE FRACTURE (Left)  Patient Location: PACU  Anesthesia Type:General and GA combined with regional for post-op pain  Level of Consciousness: awake, alert  and oriented  Airway and Oxygen Therapy: Patient Spontanous Breathing and Patient connected to nasal cannula oxygen  Post-op Pain: none  Post-op Assessment: Post-op Vital signs reviewed, Patient's Cardiovascular Status Stable, Respiratory Function Stable, Patent Airway, No signs of Nausea or vomiting and Pain level controlled  Post-op Vital Signs: stable  Last Vitals:  Filed Vitals:   02/01/14 2215  BP: 141/67  Pulse: 105  Temp:   Resp: 19    Complications: No apparent anesthesia complications

## 2014-02-01 NOTE — Anesthesia Preprocedure Evaluation (Addendum)
Anesthesia Evaluation  Patient identified by MRN, date of birth, ID band Patient awake    Reviewed: Allergy & Precautions, H&P , NPO status , Patient's Chart, lab work & pertinent test results  Airway Mallampati: I  TM Distance: >3 FB Neck ROM: Full    Dental   Pulmonary former smoker,          Cardiovascular negative cardio ROS      Neuro/Psych negative neurological ROS  negative psych ROS   GI/Hepatic negative GI ROS, Neg liver ROS,   Endo/Other  negative endocrine ROS  Renal/GU negative Renal ROS     Musculoskeletal   Abdominal   Peds  Hematology negative hematology ROS (+)   Anesthesia Other Findings   Reproductive/Obstetrics                           Anesthesia Physical Anesthesia Plan  ASA: I  Anesthesia Plan: General   Post-op Pain Management:    Induction: Intravenous  Airway Management Planned: Oral ETT  Additional Equipment:   Intra-op Plan:   Post-operative Plan: Extubation in OR  Informed Consent: I have reviewed the patients History and Physical, chart, labs and discussed the procedure including the risks, benefits and alternatives for the proposed anesthesia with the patient or authorized representative who has indicated his/her understanding and acceptance.   Dental advisory given  Plan Discussed with: CRNA, Surgeon and Anesthesiologist  Anesthesia Plan Comments:        Anesthesia Quick Evaluation

## 2014-02-01 NOTE — OR Nursing (Signed)
Dr. Eulah PontMurphy did not sign pt's prescriptions for percocet and zofran.  Called answering service.  Josh, PA returned call.  Zofran prescription called to [facility].  Pt. Has hydrocodone at home to take overnight if needed.  Pt. Will call office tomorrow for prescription pain meds.  Pt.'s pain is 0 with interscalene nerve block.  He was educated regarding the need to take pain meds as nerve block starts to wear off tomorrow.

## 2014-02-01 NOTE — Anesthesia Procedure Notes (Signed)
Procedure Name: Intubation Date/Time: 02/01/2014 5:06 PM Performed by: Susa Loffler Pre-anesthesia Checklist: Patient identified, Patient being monitored, Emergency Drugs available, Suction available and Timeout performed Patient Re-evaluated:Patient Re-evaluated prior to inductionOxygen Delivery Method: Circle system utilized Preoxygenation: Pre-oxygenation with 100% oxygen Intubation Type: IV induction Ventilation: Mask ventilation without difficulty Laryngoscope Size: Mac and 4 Grade View: Grade II Tube type: Oral Tube size: 7.5 mm Number of attempts: 1 Airway Equipment and Method: Stylet and LTA kit utilized Placement Confirmation: ETT inserted through vocal cords under direct vision,  positive ETCO2 and breath sounds checked- equal and bilateral Secured at: 23 cm Tube secured with: Tape Dental Injury: Teeth and Oropharynx as per pre-operative assessment

## 2014-02-01 NOTE — Progress Notes (Signed)
Dr Eulah PontMurphy currently at main [facility] OR. [facility]. Decision made by Dr Glade Stanford Fitzgerald and [facility]ne McFadder, Director to move patient to main for surgery when Dr Eulah PontMurphy is available

## 2014-02-01 NOTE — Progress Notes (Signed)
IV changed to saline lock

## 2014-02-01 NOTE — Discharge Instructions (Signed)
Sling full time  Keep your dressing dry and intact until follow up

## 2014-02-01 NOTE — OR Nursing (Signed)
Discussed pain management with Dr. Noreene LarssonJoslin.  Pt has received Dilaudid 2 mg IV and Oxy IR 5mg  and still has pain 10/10.  Dr. Noreene LarssonJoslin discussed option of interscalene Nerve block on Left Shoulder.  Pt. Consented to procedure and time out performed.

## 2014-02-01 NOTE — Op Note (Signed)
02/01/2014  7:16 PM  PATIENT:  Patrick KurtzStephen V Fuhr    PRE-OPERATIVE DIAGNOSIS:  clavicle fx  POST-OPERATIVE DIAGNOSIS:  Same  PROCEDURE:  OPEN REDUCTION INTERNAL FIXATION (ORIF) CLAVICULAR FRACTURE  SURGEON:  Sheral ApleyMURPHY, Prescious Hurless, D, MD  PHYSICIAN ASSISTANT: Janace LittenBrandon Parry, OPA, He was necessary for efficiency and safety of the case.   ANESTHESIA:   General  PREOPERATIVE INDICATIONS:  Patrick KurtzStephen V Oconnell is a  43 y.o. male with a diagnosis of clavicle fx who elected for surgical management based on preoperative shortening and angulation and displacement of the fracture.    The risks benefits and alternatives were discussed with the patient preoperatively including but not limited to the risks of infection, bleeding, nerve injury, malunion, nonunion, hardware failure, the need for hardware removal, recurrent fracture, cardiopulmonary complications, the need for revision surgery, among others, and the patient was willing to proceed.    OPERATIVE IMPLANTS: stryker lateral clavicle plate  OPERATIVE FINDINGS: Shortened, displaced clavicle fracture  OPERATIVE PROCEDURE: The patient was brought to the operating room and placed in the supine position. General anesthesia was administered. IV antibiotics were given. He was placed in the beach chair position. The upper extremity was prepped and draped in the usual sterile fashion. Time out was performed. Incision was made over the clavicle fracture. Dissection was carried down through the platysma, and the fracture site exposed. The fracture was extremely short.  I ultimately did however achieve satisfactory mobilization.   The inferior aspect the clavicle had been comminuted into multiple small pieces there was one small piece that had both the CC ligaments are attached to it.  This bony piece was too small to fix back to the clavicle there is also a large void at the section as well. At this point I elected to do a CC ligament reconstruction as well.  I  placed a cerclage stitch around one of the larger butterfly pieces that had a small amount of cc segment on it and held it in place as careful to stay subperiosteal with the stitch. I then selected a lateral locking plate and fixed it proximally. I then placed 4 locking screws in the distal piece and that was all that could be achieved took multiple x-rays to confirm no penetration of the seat of the acromioclavicular joint.  I was happy with the reduction again there was a small piece that had the vast majority of the CC ligament that was too small to be fixed it back to this undersurface of the distal clavicle. I placed 2 proximal screws getting 3 good strong bites medially and the plate.  Next I turned my attention to the CC ligament. I clamped that small piece into place and drilled with the Arthrex drill guide for Endobutton and dog bone technique. I exposed the superior aspect of the coracoid drill the center hole through this and confirmed on fluoroscopy good placement.  I then passed the Endobutton across the coracoid and confirmed good flip on fluoroscopy. Attention this down and had an excellent reduction through the hold the plate.  The small piece that did have most of the CC ligament on it I placed a stitch through the CC ligament and around this piece and tied it up to the empty hole in the plate. This provided good opposition.  I then took final C-arm pictures, irrigated the wounds copiously, and repaired the fascia with inverted figure-of-eight Vicryl suture. The subcutaneous tissue was closed with Vicryl as well, and the skin closed with steri-strips, and  the patient was awakened and returned to the PACU in stable and satisfactory condition. There were no complications.   POSTOPERATIVE PLAN: Sling full time, DVT px: ambulation  This note was generated using a template and dragon dictation system. In light of that, I have reviewed the note and all aspects of it are applicable to this  case. Any dictation errors are due to the computerized dictation system.

## 2014-02-01 NOTE — Transfer of Care (Signed)
Immediate Anesthesia Transfer of Care Note  Patient: Patrick KurtzStephen V Oconnell  Procedure(s) Performed: Procedure(s): OPEN REDUCTION INTERNAL FIXATION (ORIF) LEFT CLAVICLE FRACTURE (Left)  Patient Location: PACU  Anesthesia Type:General  Level of Consciousness: awake, alert  and oriented  Airway & Oxygen Therapy: Patient Spontanous Breathing and Patient connected to nasal cannula oxygen  Post-op Assessment: Report given to PACU RN and Post -op Vital signs reviewed and stable  Post vital signs: Reviewed and stable  Complications: No apparent anesthesia complications

## 2014-02-05 ENCOUNTER — Encounter (HOSPITAL_BASED_OUTPATIENT_CLINIC_OR_DEPARTMENT_OTHER): Payer: Self-pay | Admitting: Orthopedic Surgery

## 2014-02-06 ENCOUNTER — Encounter (HOSPITAL_COMMUNITY): Payer: Self-pay | Admitting: Orthopedic Surgery

## 2014-06-24 NOTE — H&P (Signed)
  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 Symptoms are rated as moderate to severe, and have been worsening.  This is significantly impairing activities of daily living.  He has elected for surgical management.   Past Medical History  Diagnosis Date  . Low testosterone    Past Surgical History  Procedure Laterality Date  . Clavicle surgery  2012    fx right clavical  . Orif clavicular fracture Left 02/01/2014    Procedure: OPEN REDUCTION INTERNAL FIXATION (ORIF) LEFT CLAVICLE FRACTURE;  Surgeon: Sheral Apleyimothy D Murphy, MD;  Location: [facility];  Service: Orthopedics;  Laterality: Left;  . Orif clavicular fracture Left 02/01/2014    Procedure: OPEN REDUCTION INTERNAL FIXATION (ORIF) CLAVICULAR FRACTURE;  Surgeon: Sheral Apleyimothy D Murphy, MD;  Location: [facility];  Service: Orthopedics;  Laterality: Left;   History   Social History  . Marital Status: Single    Spouse Name: N/A  . Number of Children: N/A  . Years of Education: N/A   Social History Main Topics  . Smoking status: Former Smoker    Quit date: 02/01/1999  . Smokeless tobacco: Not on file  . Alcohol Use: Yes     Comment: socially   . Drug Use: Yes    Special: Marijuana  . Sexual Activity: Not on file   Other Topics Concern  . Not on file   Social History Narrative   No family history on file. No Known Allergies Prior to Admission medications   Medication Sig Start Date End Date Taking? Authorizing Provider  clomiPHENE (CLOMID) 50 MG tablet Take 1 tablet by mouth daily. 12/09/13   Historical Provider, MD  ondansetron (ZOFRAN) 4 MG tablet Take 1 tablet (4 mg total) by mouth every 8 (eight) hours as needed for nausea. 02/01/14   Sheral Apleyimothy D Murphy, MD   oxyCODONE-acetaminophen (ROXICET) 5-325 MG per tablet Take 1-2 tablets by mouth every 4 (four) hours as needed. 02/01/14   Sheral Apleyimothy D Murphy, MD     Positive ROS: All other systems have been reviewed and were otherwise negative with the exception of those mentioned in the HPI and as above.  Physical Exam: General: Alert, no acute distress Cardiovascular: No pedal edema Respiratory: No cyanosis, no use of accessory musculature GI: No organomegaly, abdomen is soft and non-tender Skin: No lesions in the area of chief complaint Neurologic: Sensation intact distally Psychiatric: Patient is competent for consent with normal mood and affect Lymphatic: No axillary or cervical lymphadenopathy  MUSCULOSKELETAL: No swelling or erythema of the right shoulder or clavicle, tender to palpation over the head of the clavicle pin, full ROM and strength of the shoulder, sensation intact with 2+ distal pulses  Assessment: 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   Lynann BolognaKelly,Kyera Felan Marie, PA-C  06/24/2014 6:59 AM

## 2014-07-04 ENCOUNTER — Encounter (HOSPITAL_BASED_OUTPATIENT_CLINIC_OR_DEPARTMENT_OTHER): Payer: Self-pay | Admitting: *Deleted

## 2014-07-06 ENCOUNTER — Ambulatory Visit (HOSPITAL_BASED_OUTPATIENT_CLINIC_OR_DEPARTMENT_OTHER): Payer: BLUE CROSS/BLUE SHIELD | Admitting: Anesthesiology

## 2014-07-06 ENCOUNTER — Ambulatory Visit (HOSPITAL_BASED_OUTPATIENT_CLINIC_OR_DEPARTMENT_OTHER)
Admission: RE | Admit: 2014-07-06 | Discharge: 2014-07-06 | Disposition: A | Discharge status: Home / Self Care | Payer: BLUE CROSS/BLUE SHIELD | Source: Ambulatory Visit | Attending: Orthopedic Surgery | Admitting: Orthopedic Surgery

## 2014-07-06 ENCOUNTER — Encounter (HOSPITAL_BASED_OUTPATIENT_CLINIC_OR_DEPARTMENT_OTHER): Payer: Self-pay | Admitting: *Deleted

## 2014-07-06 ENCOUNTER — Encounter (HOSPITAL_BASED_OUTPATIENT_CLINIC_OR_DEPARTMENT_OTHER)
Admission: RE | Disposition: A | Discharge status: Home / Self Care | Payer: Self-pay | Source: Ambulatory Visit | Attending: Orthopedic Surgery

## 2014-07-06 DIAGNOSIS — Y929 Unspecified place or not applicable: Secondary | ICD-10-CM | POA: Insufficient documentation

## 2014-07-06 DIAGNOSIS — Z79891 Long term (current) use of opiate analgesic: Secondary | ICD-10-CM | POA: Diagnosis not present

## 2014-07-06 DIAGNOSIS — E291 Testicular hypofunction: Secondary | ICD-10-CM | POA: Diagnosis not present

## 2014-07-06 DIAGNOSIS — Z87891 Personal history of nicotine dependence: Secondary | ICD-10-CM | POA: Insufficient documentation

## 2014-07-06 DIAGNOSIS — Y838 Other surgical procedures as the cause of abnormal reaction of the patient, or of later complication, without mention of misadventure at the time of the procedure: Secondary | ICD-10-CM | POA: Diagnosis not present

## 2014-07-06 DIAGNOSIS — T8489XA Other specified complication of internal orthopedic prosthetic devices, implants and grafts, initial encounter: Secondary | ICD-10-CM | POA: Insufficient documentation

## 2014-07-06 DIAGNOSIS — Z79899 Other long term (current) drug therapy: Secondary | ICD-10-CM | POA: Diagnosis not present

## 2014-07-06 HISTORY — DX: Anxiety disorder, unspecified: F41.9

## 2014-07-06 HISTORY — PX: HARDWARE REMOVAL: SHX979

## 2014-07-06 LAB — POCT HEMOGLOBIN-HEMACUE: Hemoglobin: 14.7 g/dL (ref 13.0–17.0)

## 2014-07-06 SURGERY — REMOVAL, HARDWARE
Anesthesia: General | Site: Shoulder | Laterality: Right

## 2014-07-06 MED ORDER — CHLORHEXIDINE GLUCONATE 4 % EX LIQD: 60.0000 mL | Freq: Once | CUTANEOUS | Status: DC

## 2014-07-06 MED ORDER — FENTANYL CITRATE (PF) 100 MCG/2ML IJ SOLN
INTRAMUSCULAR | Status: AC
  Filled 2014-07-06: qty 4

## 2014-07-06 MED ORDER — DEXTROSE-NACL 5-0.45 % IV SOLN: INTRAVENOUS | Status: DC

## 2014-07-06 MED ORDER — BUPIVACAINE HCL (PF) 0.5 % IJ SOLN
INTRAMUSCULAR | Status: DC | PRN
  Administered 2014-07-06: 7 mL

## 2014-07-06 MED ORDER — FENTANYL CITRATE (PF) 100 MCG/2ML IJ SOLN
INTRAMUSCULAR | Status: DC | PRN
  Administered 2014-07-06: 100 ug via INTRAVENOUS

## 2014-07-06 MED ORDER — MIDAZOLAM HCL 2 MG/2ML IJ SOLN
1.0000 mg | INTRAMUSCULAR | Status: DC | PRN
  Administered 2014-07-06: 2 mg via INTRAVENOUS

## 2014-07-06 MED ORDER — SUCCINYLCHOLINE CHLORIDE 20 MG/ML IJ SOLN
INTRAMUSCULAR | Status: DC | PRN
  Administered 2014-07-06: 100 mg via INTRAVENOUS

## 2014-07-06 MED ORDER — LIDOCAINE HCL (CARDIAC) 20 MG/ML IV SOLN
INTRAVENOUS | Status: DC | PRN
  Administered 2014-07-06: 50 mg via INTRAVENOUS

## 2014-07-06 MED ORDER — MIDAZOLAM HCL 2 MG/2ML IJ SOLN
INTRAMUSCULAR | Status: AC
  Filled 2014-07-06: qty 2

## 2014-07-06 MED ORDER — BUPIVACAINE HCL (PF) 0.5 % IJ SOLN
INTRAMUSCULAR | Status: AC
  Filled 2014-07-06: qty 30

## 2014-07-06 MED ORDER — FENTANYL CITRATE (PF) 100 MCG/2ML IJ SOLN: 50.0000 ug | INTRAMUSCULAR | Status: DC | PRN

## 2014-07-06 MED ORDER — ONDANSETRON HCL 4 MG/2ML IJ SOLN
INTRAMUSCULAR | Status: DC | PRN
  Administered 2014-07-06: 4 mg via INTRAVENOUS

## 2014-07-06 MED ORDER — GLYCOPYRROLATE 0.2 MG/ML IJ SOLN: 0.2000 mg | Freq: Once | INTRAMUSCULAR | Status: DC | PRN

## 2014-07-06 MED ORDER — ACETAMINOPHEN 500 MG PO TABS
1000.0000 mg | ORAL_TABLET | Freq: Once | ORAL | Status: AC
  Administered 2014-07-06: 1000 mg via ORAL

## 2014-07-06 MED ORDER — DOCUSATE SODIUM 100 MG PO CAPS: 100.0000 mg | ORAL_CAPSULE | Freq: Two times a day (BID) | ORAL | Status: AC

## 2014-07-06 MED ORDER — ONDANSETRON HCL 4 MG PO TABS: 4.0000 mg | ORAL_TABLET | Freq: Three times a day (TID) | ORAL | Status: AC | PRN

## 2014-07-06 MED ORDER — CEFAZOLIN SODIUM-DEXTROSE 2-3 GM-% IV SOLR
2.0000 g | INTRAVENOUS | Status: AC
  Administered 2014-07-06: 2 g via INTRAVENOUS

## 2014-07-06 MED ORDER — HYDROCODONE-ACETAMINOPHEN 5-325 MG PO TABS: 1.0000 | ORAL_TABLET | Freq: Four times a day (QID) | ORAL | Status: AC | PRN

## 2014-07-06 MED ORDER — DEXAMETHASONE SODIUM PHOSPHATE 4 MG/ML IJ SOLN
INTRAMUSCULAR | Status: DC | PRN
  Administered 2014-07-06: 10 mg via INTRAVENOUS

## 2014-07-06 MED ORDER — PROPOFOL 10 MG/ML IV BOLUS
INTRAVENOUS | Status: DC | PRN
  Administered 2014-07-06: 200 mg via INTRAVENOUS

## 2014-07-06 MED ORDER — LACTATED RINGERS IV SOLN
INTRAVENOUS | Status: DC
  Administered 2014-07-06 (×2): via INTRAVENOUS

## 2014-07-06 SURGICAL SUPPLY — 62 items
BANDAGE ELASTIC 4 VELCRO ST LF (GAUZE/BANDAGES/DRESSINGS) ×1 IMPLANT
BLADE SURG 15 STRL LF DISP TIS (BLADE) ×1 IMPLANT
BLADE SURG 15 STRL SS (BLADE) ×2
BNDG CMPR 9X4 STRL LF SNTH (GAUZE/BANDAGES/DRESSINGS)
BNDG COHESIVE 4X5 TAN STRL (GAUZE/BANDAGES/DRESSINGS) ×2 IMPLANT
BNDG ESMARK 4X9 LF (GAUZE/BANDAGES/DRESSINGS) ×1 IMPLANT
CHLORAPREP W/TINT 26ML (MISCELLANEOUS) ×2 IMPLANT
CLSR STERI-STRIP ANTIMIC 1/2X4 (GAUZE/BANDAGES/DRESSINGS) ×1 IMPLANT
COVER BACK TABLE 60X90IN (DRAPES) ×2 IMPLANT
CUFF TOURNIQUET SINGLE 24IN (TOURNIQUET CUFF) IMPLANT
CUFF TOURNIQUET SINGLE 34IN LL (TOURNIQUET CUFF) IMPLANT
DECANTER SPIKE VIAL GLASS SM (MISCELLANEOUS) ×1 IMPLANT
DRAPE EXTREMITY T 121X128X90 (DRAPE) ×2 IMPLANT
DRAPE OEC MINIVIEW 54X84 (DRAPES) ×2 IMPLANT
DRAPE SURG 17X23 STRL (DRAPES) IMPLANT
DRAPE U 20/CS (DRAPES) ×2 IMPLANT
DRAPE U-SHAPE 47X51 STRL (DRAPES) IMPLANT
DRAPE U-SHAPE 76X120 STRL (DRAPES) ×2 IMPLANT
DRSG EMULSION OIL 3X3 NADH (GAUZE/BANDAGES/DRESSINGS) ×2 IMPLANT
ELECT REM PT RETURN 9FT ADLT (ELECTROSURGICAL) ×2
ELECTRODE REM PT RTRN 9FT ADLT (ELECTROSURGICAL) ×1 IMPLANT
GAUZE SPONGE 4X4 12PLY STRL (GAUZE/BANDAGES/DRESSINGS) ×2 IMPLANT
GAUZE XEROFORM 1X8 LF (GAUZE/BANDAGES/DRESSINGS) ×2 IMPLANT
GLOVE BIO SURGEON STRL SZ7 (GLOVE) ×2 IMPLANT
GLOVE BIO SURGEON STRL SZ7.5 (GLOVE) ×2 IMPLANT
GLOVE BIOGEL M STRL SZ7.5 (GLOVE) ×1 IMPLANT
GLOVE BIOGEL PI IND STRL 7.0 (GLOVE) ×1 IMPLANT
GLOVE BIOGEL PI IND STRL 8 (GLOVE) ×2 IMPLANT
GLOVE BIOGEL PI INDICATOR 7.0 (GLOVE) ×1
GLOVE BIOGEL PI INDICATOR 8 (GLOVE) ×2
GOWN STRL REUS W/ TWL LRG LVL3 (GOWN DISPOSABLE) ×4 IMPLANT
GOWN STRL REUS W/ TWL XL LVL3 (GOWN DISPOSABLE) ×1 IMPLANT
GOWN STRL REUS W/TWL LRG LVL3 (GOWN DISPOSABLE) ×6
GOWN STRL REUS W/TWL XL LVL3 (GOWN DISPOSABLE) ×2
NDL HYPO 25X1 1.5 SAFETY (NEEDLE) ×1 IMPLANT
NEEDLE HYPO 25X1 1.5 SAFETY (NEEDLE) ×2 IMPLANT
NS IRRIG 1000ML POUR BTL (IV SOLUTION) ×2 IMPLANT
PACK BASIN DAY SURGERY FS (CUSTOM PROCEDURE TRAY) ×2 IMPLANT
PAD CAST 4YDX4 CTTN HI CHSV (CAST SUPPLIES) ×1 IMPLANT
PADDING CAST ABS 4INX4YD NS (CAST SUPPLIES)
PADDING CAST ABS COTTON 4X4 ST (CAST SUPPLIES) ×1 IMPLANT
PADDING CAST COTTON 4X4 STRL (CAST SUPPLIES) ×2
PENCIL BUTTON HOLSTER BLD 10FT (ELECTRODE) ×2 IMPLANT
SHEET MEDIUM DRAPE 40X70 STRL (DRAPES) IMPLANT
SLEEVE SCD COMPRESS KNEE MED (MISCELLANEOUS) IMPLANT
SPONGE LAP 4X18 X RAY DECT (DISPOSABLE) ×2 IMPLANT
STOCKINETTE 4X48 STRL (DRAPES) IMPLANT
STOCKINETTE 6  STRL (DRAPES) ×1
STOCKINETTE 6 STRL (DRAPES) ×1 IMPLANT
SUCTION FRAZIER TIP 10 FR DISP (SUCTIONS) IMPLANT
SUT ETHILON 3 0 PS 1 (SUTURE) ×1 IMPLANT
SUT MON AB 4-0 PC3 18 (SUTURE) IMPLANT
SUT PROLENE 3 0 PS 2 (SUTURE) IMPLANT
SUT VIC AB 2-0 SH 27 (SUTURE)
SUT VIC AB 2-0 SH 27XBRD (SUTURE) IMPLANT
SUT VIC AB 3-0 FS2 27 (SUTURE) IMPLANT
SYR BULB 3OZ (MISCELLANEOUS) ×2 IMPLANT
SYR CONTROL 10ML LL (SYRINGE) ×1 IMPLANT
TOWEL OR 17X24 6PK STRL BLUE (TOWEL DISPOSABLE) ×2 IMPLANT
TOWEL OR NON WOVEN STRL DISP B (DISPOSABLE) ×2 IMPLANT
TUBE CONNECTING 20X1/4 (TUBING) ×1 IMPLANT
UNDERPAD 30X30 (UNDERPADS AND DIAPERS) ×2 IMPLANT

## 2014-07-06 NOTE — Interval H&P Note (Signed)
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 After consideration of risks, benefits and other options for treatment, the patient has consented to  Procedure(s): RIGHT CLAVICLE REMOVAL HARDWARE  (Right) as a surgical intervention .  The patient's history has been reviewed, patient examined, no change in status, stable for surgery.  I have reviewed the patient's chart and labs.  Questions were answered to the patient's satisfaction.     Patrick Oconnell

## 2014-07-06 NOTE — Anesthesia Postprocedure Evaluation (Signed)
  Anesthesia Post-op Note  Patient: Faylene KurtzStephen V Helms  Procedure(s) Performed: Procedure(s): RIGHT CLAVICLE REMOVAL HARDWARE  (Right)  Patient Location: PACU  Anesthesia Type:General  Level of Consciousness: awake and alert   Airway and Oxygen Therapy: Patient Spontanous Breathing  Post-op Pain: mild  Post-op Assessment: Post-op Vital signs reviewed, Patient's Cardiovascular Status Stable and Respiratory Function Stable  Post-op Vital Signs: Reviewed  Filed Vitals:   07/06/14 1400  BP: 125/78  Pulse: 81  Temp:   Resp: 17    Complications: No apparent anesthesia complications

## 2014-07-06 NOTE — Discharge Instructions (Signed)
Keep dressing clean and dry for 3 days, then ok to remove and get wet.  Gentle soap and water. Pat dry.  No tub baths or soaking.  WBAT in the RUE   Post Anesthesia Home Care Instructions  Activity: Get plenty of rest for the remainder of the day. A responsible adult should stay with you for 24 hours following the procedure.  For the next 24 hours, DO NOT: -Drive a car -Advertising copywriterperate machinery -Drink alcoholic beverages -Take any medication unless instructed by your physician -Make any legal decisions or sign important papers.  Meals: Start with liquid foods such as gelatin or soup. Progress to regular foods as tolerated. Avoid greasy, spicy, heavy foods. If nausea and/or vomiting occur, drink only clear liquids until the nausea and/or vomiting subsides. Call your physician if vomiting continues.  Special Instructions/Symptoms: Your throat may feel dry or sore from the anesthesia or the breathing tube placed in your throat during surgery. If this causes discomfort, gargle with warm salt water. The discomfort should disappear within 24 hours.  If you had a scopolamine patch placed behind your ear for the management of post- operative nausea and/or vomiting:  1. The medication in the patch is effective for 72 hours, after which it should be removed.  Wrap patch in a tissue and discard in the trash. Wash hands thoroughly with soap and water. 2. You may remove the patch earlier than 72 hours if you experience unpleasant side effects which may include dry mouth, dizziness or visual disturbances. 3. Avoid touching the patch. Wash your hands with soap and water after contact with the patch.

## 2014-07-06 NOTE — Transfer of Care (Signed)
Immediate Anesthesia Transfer of Care Note  Patient: Patrick KurtzStephen V Oconnell  Procedure(s) Performed: Procedure(s): RIGHT CLAVICLE REMOVAL HARDWARE  (Right)  Patient Location: PACU  Anesthesia Type:General  Level of Consciousness: awake  Airway & Oxygen Therapy: Patient Spontanous Breathing and Patient connected to face mask oxygen  Post-op Assessment: Report given to RN and Post -op Vital signs reviewed and stable  Post vital signs: Reviewed and stable  Last Vitals:  Filed Vitals:   07/06/14 1344  BP:   Pulse: 90  Temp:   Resp: 15    Complications: No apparent anesthesia complications

## 2014-07-06 NOTE — Anesthesia Procedure Notes (Signed)
Procedure Name: Intubation Date/Time: 07/06/2014 12:56 PM Performed by: Caren MacadamARTER, Yarrow Linhart W Pre-anesthesia Checklist: Patient identified, Emergency Drugs available, Suction available and Patient being monitored Patient Re-evaluated:Patient Re-evaluated prior to inductionOxygen Delivery Method: Circle System Utilized Preoxygenation: Pre-oxygenation with 100% oxygen Intubation Type: IV induction Ventilation: Mask ventilation without difficulty Laryngoscope Size: Miller and 2 Grade View: Grade I Tube type: Oral Tube size: 8.0 mm Number of attempts: 1 Airway Equipment and Method: Stylet and Oral airway Placement Confirmation: ETT inserted through vocal cords under direct vision,  positive ETCO2 and breath sounds checked- equal and bilateral Secured at: 23 cm Tube secured with: Tape Dental Injury: Teeth and Oropharynx as per pre-operative assessment

## 2014-07-06 NOTE — Op Note (Signed)
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   ANESTHESIA:   gen  PREOPERATIVE INDICATIONS:  Patrick KurtzStephen V Oconnell is a  44 y.o. 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    The risks benefits and alternatives were discussed with the patient preoperatively including but not limited to the risks of infection, bleeding, nerve injury, cardiopulmonary complications, the need for revision surgery, among others, and the patient was willing to proceed.  OPERATIVE IMPLANTS: none  OPERATIVE FINDINGS: removal of clavical pin lateral portion  BLOOD LOSS: min  COMPLICATIONS: none  TOURNIQUET TIME: none  OPERATIVE PROCEDURE:  Patient was identified in the preoperative holding area and site was marked by me He was transported to the operating theater and placed on the table in supine position taking care to pad all bony prominences. After a preincinduction time out anesthesia was induced. The right upper extremity was prepped and draped in normal sterile fashion and a pre-incision timeout was performed. He received ancef for preoperative antibiotics.   I made a longitudinal incision over his previous incision for the insertion site. Using fluoroscopic guidance inserted a hemostat obtain purchase around the neck of the clavicle nail.  We discussed the patient not going after the larger deep portion which is getting the lateral part out.  I was able to work ahead out of the wound  and then get a hold of it with a wire driver.  The lateral piece then did come out hole and I confirmed this with x-ray.  I thoroughly irrigated his wound obtained hemostasis and closed with simple stitches. Sterile dressing was applied he was taking the PACU in stable condition.  POST OPERATIVE PLAN: WBAT, Percocet for pain control, ambulate for dvt px    This note was generated using a template and dragon dictation system. In light of that, I have reviewed the note and all aspects of it are applicable to this case. Any dictation errors are due to the computerized dictation system.

## 2014-07-06 NOTE — Anesthesia Preprocedure Evaluation (Signed)
Anesthesia Evaluation  Patient identified by MRN, date of birth, ID band Patient awake    Reviewed: Allergy & Precautions, NPO status , Patient's Chart, lab work & pertinent test results  Airway Mallampati: I  TM Distance: >3 FB Neck ROM: Full    Dental  (+) Teeth Intact, Dental Advisory Given   Pulmonary former smoker,  breath sounds clear to auscultation        Cardiovascular Rhythm:Regular Rate:Normal     Neuro/Psych    GI/Hepatic   Endo/Other    Renal/GU      Musculoskeletal   Abdominal   Peds  Hematology   Anesthesia Other Findings   Reproductive/Obstetrics                             Anesthesia Physical Anesthesia Plan  ASA: I  Anesthesia Plan: General   Post-op Pain Management:    Induction: Intravenous  Airway Management Planned: Oral ETT  Additional Equipment:   Intra-op Plan:   Post-operative Plan: Extubation in OR  Informed Consent: I have reviewed the patients History and Physical, chart, labs and discussed the procedure including the risks, benefits and alternatives for the proposed anesthesia with the patient or authorized representative who has indicated his/her understanding and acceptance.   Dental advisory given  Plan Discussed with: CRNA, Anesthesiologist and Surgeon  Anesthesia Plan Comments:         Anesthesia Quick Evaluation  

## 2014-07-09 ENCOUNTER — Encounter (HOSPITAL_BASED_OUTPATIENT_CLINIC_OR_DEPARTMENT_OTHER): Payer: Self-pay | Admitting: Orthopedic Surgery

## 2014-10-30 ENCOUNTER — Emergency Department (HOSPITAL_COMMUNITY)
Admission: EM | Admit: 2014-10-30 | Discharge: 2014-10-30 | Disposition: A | Discharge status: Home / Self Care | Payer: BLUE CROSS/BLUE SHIELD | Attending: Physician Assistant | Admitting: Physician Assistant

## 2014-10-30 ENCOUNTER — Encounter (HOSPITAL_COMMUNITY): Payer: Self-pay | Admitting: Emergency Medicine

## 2014-10-30 DIAGNOSIS — R0602 Shortness of breath: Secondary | ICD-10-CM | POA: Insufficient documentation

## 2014-10-30 DIAGNOSIS — R002 Palpitations: Secondary | ICD-10-CM | POA: Diagnosis not present

## 2014-10-30 DIAGNOSIS — Z8639 Personal history of other endocrine, nutritional and metabolic disease: Secondary | ICD-10-CM | POA: Diagnosis not present

## 2014-10-30 DIAGNOSIS — Z87891 Personal history of nicotine dependence: Secondary | ICD-10-CM | POA: Diagnosis not present

## 2014-10-30 DIAGNOSIS — Z79899 Other long term (current) drug therapy: Secondary | ICD-10-CM | POA: Diagnosis not present

## 2014-10-30 DIAGNOSIS — F419 Anxiety disorder, unspecified: Secondary | ICD-10-CM | POA: Insufficient documentation

## 2014-10-30 LAB — I-STAT TROPONIN, ED: Troponin i, poc: 0 ng/mL (ref 0.00–0.08)

## 2014-10-30 NOTE — Discharge Instructions (Signed)

## 2014-10-30 NOTE — ED Notes (Signed)
MD at bedside. 

## 2014-10-30 NOTE — ED Notes (Signed)
Patient states he has been having problems with acid reflux, states he woke up this morning and began having an anxiety attack. C/o sweaty palms, arm tingling and SOB. Patient able to speak in complete sentences without difficulty. Patient reports a prior anxiety attack with similar symptoms in the past. Patient states he does tend to get "a bit of anxiety" with his acid reflux.

## 2014-10-30 NOTE — ED Provider Notes (Signed)
CSN: [id number]     Arrival date & time 10/30/14  0600 History   First MD Initiated Contact with Patient 10/30/14 0700     Chief Complaint  Patient presents with  . Panic Attack     (Consider location/radiation/quality/duration/timing/severity/associated sxs/prior Treatment) HPI   Patient is a 44 year old male with anxiety attack. Patient has had this multiple times in the past. He awoke with sleep with it.  He had tingling in bilateral hands and mouth. +Left arm tingling. SOB, stab of chest pain.   Past Medical History  Diagnosis Date  . Low testosterone   . Anxiety    Past Surgical History  Procedure Laterality Date  . Clavicle surgery  2012    fx right clavical  . Orif clavicular fracture Left 02/01/2014    Procedure: OPEN REDUCTION INTERNAL FIXATION (ORIF) LEFT CLAVICLE FRACTURE;  Surgeon: Sheral Apley, MD;  Location: [facility];  Service: Orthopedics;  Laterality: Left;  . Orif clavicular fracture Left 02/01/2014    Procedure: OPEN REDUCTION INTERNAL FIXATION (ORIF) CLAVICULAR FRACTURE;  Surgeon: Sheral Apley, MD;  Location: [facility];  Service: Orthopedics;  Laterality: Left;  . Hardware removal Right 07/06/2014    Procedure: RIGHT CLAVICLE REMOVAL HARDWARE ;  Surgeon: Sheral Apley, MD;  Location: [facility];  Service: Orthopedics;  Laterality: Right;   No family history on file. Social History  Substance Use Topics  . Smoking status: Former Smoker    Quit date: 02/01/1999  . Smokeless tobacco: None  . Alcohol Use: Yes     Comment: socially     Review of Systems  Constitutional: Negative for fever and activity change.  Respiratory: Positive for shortness of breath.   Cardiovascular: Positive for chest pain and palpitations.  Gastrointestinal: Negative for abdominal pain.  Psychiatric/Behavioral: Positive for agitation. The patient is nervous/anxious.       Allergies  Review of patient's allergies indicates no known  allergies.  Home Medications   Prior to Admission medications   Medication Sig Start Date End Date Taking? Authorizing Provider  clomiPHENE (CLOMID) 50 MG tablet Take 25 tablets by mouth daily.  12/09/13  Yes Historical Provider, MD  docusate sodium (COLACE) 100 MG capsule Take 1 capsule (100 mg total) by mouth 2 (two) times daily. Patient not taking: Reported on 10/30/2014 07/06/14   Janalee Dane, PA-C  HYDROcodone-acetaminophen (NORCO) 5-325 MG per tablet Take 1-2 tablets by mouth every 6 (six) hours as needed for moderate pain. Patient not taking: Reported on 10/30/2014 07/06/14   Janalee Dane, PA-C  ondansetron (ZOFRAN) 4 MG tablet Take 1 tablet (4 mg total) by mouth every 8 (eight) hours as needed for nausea or vomiting. Patient not taking: Reported on 10/30/2014 07/06/14   Janalee Dane, PA-C  sildenafil (REVATIO) 20 MG tablet Take 20 mg by mouth 3 (three) times daily.    Historical Provider, MD   BP 135/87 mmHg  Pulse 76  Temp(Src) 97.9 F (36.6 C) (Oral)  Resp 20  SpO2 98% Physical Exam  Constitutional: He is oriented to person, place, and time. He appears well-nourished.  HENT:  Head: Normocephalic.  Mouth/Throat: Oropharynx is clear and moist.  Eyes: Conjunctivae are normal.  Neck: No tracheal deviation present.  Cardiovascular: Normal rate.   Pulmonary/Chest: Effort normal. No stridor. No respiratory distress.  Abdominal: Soft. There is no tenderness. There is no guarding.  Musculoskeletal: Normal range of motion. He exhibits no edema.  Neurological: He is oriented to person, place, and  time. No cranial nerve deficit.  Skin: Skin is warm and dry. No rash noted. He is not diaphoretic.  Psychiatric: He has a normal mood and affect. His behavior is normal.  Nursing note and vitals reviewed.   ED Course  Procedures (including critical care time) Labs Review Labs Reviewed  Rosezena Sensor, ED    Imaging Review No results found. I have personally reviewed and  evaluated these images and lab results as part of my medical decision-making.   EKG Interpretation   Date/Time:  Tuesday October 30 2014 06:54:09 EDT Ventricular Rate:  84 PR Interval:  144 QRS Duration: 92 QT Interval:  358 QTC Calculation: 423 R Axis:   30 Text Interpretation:  Sinus rhythm ST elev, probable normal early repol  pattern No significant change since last tracing Likely BER Confirmed by  Kandis Mannan ([id number]) on 10/30/2014 7:01:35 AM      MDM   Final diagnoses:  None    Patient is a 44 year old male presenting with anxiety attack.  Patient had this multiple times in the past. He says it feels similar to every time he had an anxiety attack. Patient has follow-up with [facility] family physicians. Patient's EKG shows benign early re-pole. He had labs drawn and we will await the troponin which I expect to be negative. Plan to discharge patient. He feels improved, back to normal.  Velita Quirk Randall An, MD 10/30/14 6045

## 2015-10-30 DIAGNOSIS — E291 Testicular hypofunction: Secondary | ICD-10-CM | POA: Diagnosis not present

## 2015-11-06 DIAGNOSIS — E291 Testicular hypofunction: Secondary | ICD-10-CM | POA: Diagnosis not present

## 2015-11-06 DIAGNOSIS — N5201 Erectile dysfunction due to arterial insufficiency: Secondary | ICD-10-CM | POA: Diagnosis not present

## 2016-08-06 DIAGNOSIS — R109 Unspecified abdominal pain: Secondary | ICD-10-CM | POA: Diagnosis not present

## 2016-08-10 ENCOUNTER — Other Ambulatory Visit: Payer: Self-pay | Admitting: Gastroenterology

## 2016-08-10 DIAGNOSIS — R935 Abnormal findings on diagnostic imaging of other abdominal regions, including retroperitoneum: Secondary | ICD-10-CM

## 2016-08-10 DIAGNOSIS — Z8619 Personal history of other infectious and parasitic diseases: Secondary | ICD-10-CM | POA: Diagnosis not present

## 2016-08-10 DIAGNOSIS — R140 Abdominal distension (gaseous): Secondary | ICD-10-CM | POA: Diagnosis not present

## 2016-08-10 DIAGNOSIS — R195 Other fecal abnormalities: Secondary | ICD-10-CM | POA: Diagnosis not present

## 2016-08-12 DIAGNOSIS — R195 Other fecal abnormalities: Secondary | ICD-10-CM | POA: Diagnosis not present

## 2016-08-17 ENCOUNTER — Ambulatory Visit
Admission: RE | Admit: 2016-08-17 | Discharge: 2016-08-17 | Disposition: A | Discharge status: Home / Self Care | Payer: BLUE CROSS/BLUE SHIELD | Source: Ambulatory Visit | Attending: Gastroenterology | Admitting: Gastroenterology

## 2016-08-17 ENCOUNTER — Other Ambulatory Visit: Payer: Self-pay | Admitting: Gastroenterology

## 2016-08-17 DIAGNOSIS — R935 Abnormal findings on diagnostic imaging of other abdominal regions, including retroperitoneum: Secondary | ICD-10-CM

## 2016-08-17 DIAGNOSIS — K760 Fatty (change of) liver, not elsewhere classified: Secondary | ICD-10-CM | POA: Diagnosis not present

## 2016-09-03 ENCOUNTER — Other Ambulatory Visit: Payer: BLUE CROSS/BLUE SHIELD

## 2016-09-18 ENCOUNTER — Ambulatory Visit
Admission: RE | Admit: 2016-09-18 | Discharge: 2016-09-18 | Disposition: A | Discharge status: Home / Self Care | Payer: BLUE CROSS/BLUE SHIELD | Source: Ambulatory Visit | Attending: Gastroenterology | Admitting: Gastroenterology

## 2016-09-18 DIAGNOSIS — R935 Abnormal findings on diagnostic imaging of other abdominal regions, including retroperitoneum: Secondary | ICD-10-CM

## 2016-09-18 DIAGNOSIS — K760 Fatty (change of) liver, not elsewhere classified: Secondary | ICD-10-CM | POA: Diagnosis not present

## 2016-09-18 MED ORDER — GADOBENATE DIMEGLUMINE 529 MG/ML IV SOLN
20.0000 mL | Freq: Once | INTRAVENOUS | Status: AC | PRN
  Administered 2016-09-18: 20 mL via INTRAVENOUS

## 2016-09-21 ENCOUNTER — Other Ambulatory Visit: Payer: BLUE CROSS/BLUE SHIELD

## 2016-12-24 DIAGNOSIS — Z125 Encounter for screening for malignant neoplasm of prostate: Secondary | ICD-10-CM | POA: Diagnosis not present

## 2016-12-24 DIAGNOSIS — E291 Testicular hypofunction: Secondary | ICD-10-CM | POA: Diagnosis not present

## 2016-12-30 DIAGNOSIS — N5201 Erectile dysfunction due to arterial insufficiency: Secondary | ICD-10-CM | POA: Diagnosis not present

## 2016-12-30 DIAGNOSIS — E291 Testicular hypofunction: Secondary | ICD-10-CM | POA: Diagnosis not present

## 2017-08-19 IMAGING — MR MR ABDOMEN WO/W CM
17 series · 48 of 48 positions shown · IV contrast (multihance)
Comparison: Ultrasound on 08/17/2016

CLINICAL DATA: Fatty liver. Possible hepatic mass on outside
ultrasound.

EXAM:
MRI ABDOMEN WITHOUT AND WITH CONTRAST
TECHNIQUE: Multiplanar multisequence MR imaging of the abdomen was performed
both before and after the administration of intravenous contrast.
CONTRAST:  20mL MULTIHANCE GADOBENATE DIMEGLUMINE 529 MG/ML IV SOLN

[Series 3: T2 · coronal · 5.0mm · 1.56mm/px · 2 of 44 slices shown (1 of 3)]
[im 1/44]
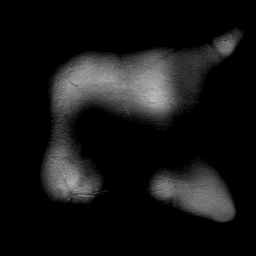
[im 44/44]
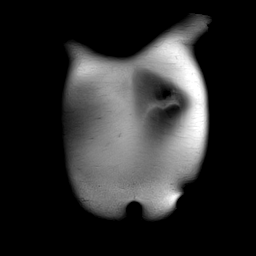

[Series 4: T1 · axial · 3.0mm · 1.19mm/px · z∈[-183,+78]mm · 6 of 176 slices shown]
[im 1/176]
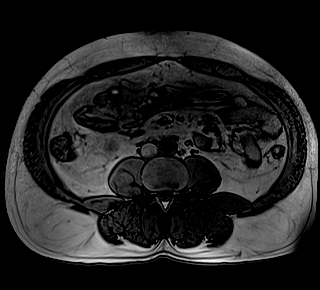
[im 36/176]
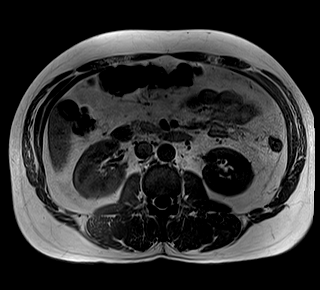
[im 71/176]
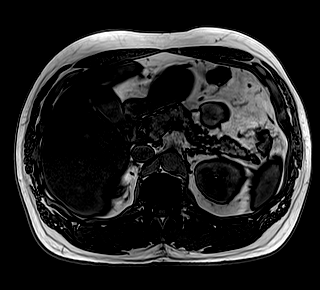
[im 106/176]
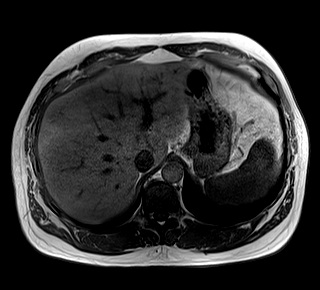
[im 141/176]
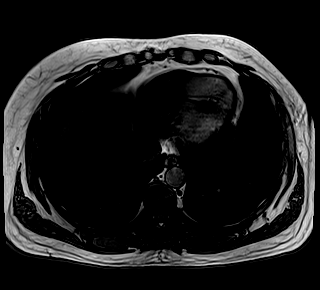
[im 176/176]
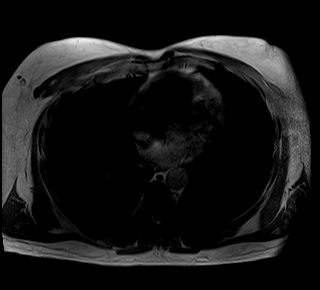

[Series 5: T2 · axial · 5.0mm · 1.48mm/px · z∈[-141,+141]mm · 2 of 48 slices shown (2 of 3)]
[im 1/48]
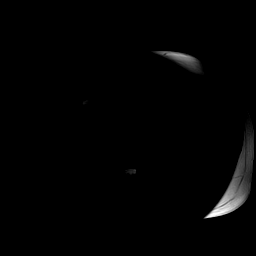
[im 48/48]
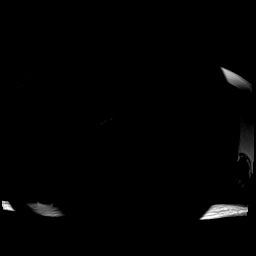

[Series 6: DWI · axial · 5.0mm · 1.42mm/px · z∈[-127,+119]mm · 4 of 126 slices shown (1 of 2)]
[im 1/126]
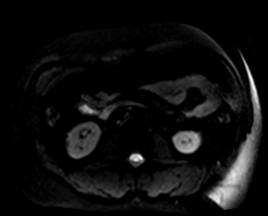
[im 42/126]
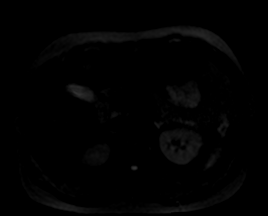
[im 84/126]
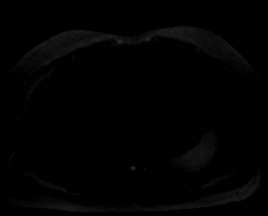
[im 126/126]
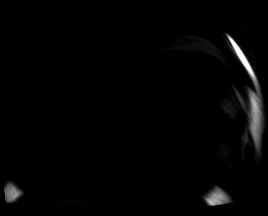

[Series 7: DWI · axial · 5.0mm · 1.42mm/px · 1 of 42 slices shown (2 of 2)]
[im 1/42]
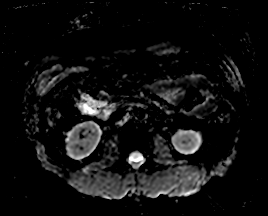

[Series 8: T2 · axial · 6.0mm · 1.19mm/px · 1 of 36 slices shown (3 of 3)]
[im 1/36]
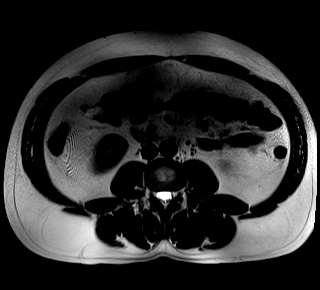

[Series 9: bSSFP · axial · 5.0mm · 1.25mm/px · z∈[-182,+76]mm · 2 of 44 slices shown]
[im 1/44]
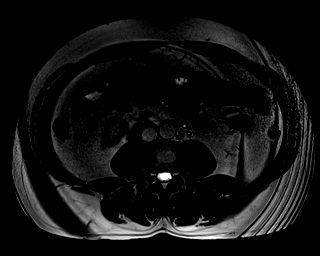
[im 44/44]
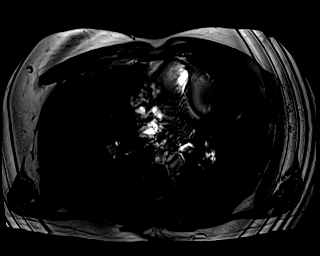

[Series 10: T1 dynamic · axial · non-contrast · 3.0mm · 1.25mm/px · z∈[-183,+78]mm · 3 of 88 slices shown]
[im 1/88]
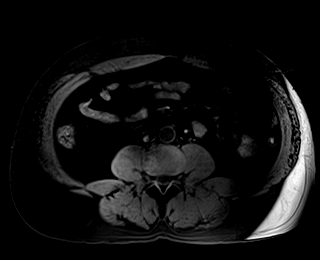
[im 44/88]
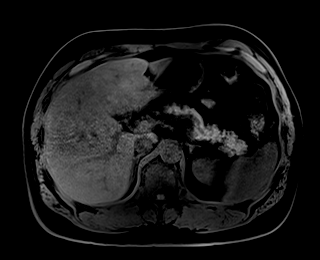
[im 88/88]
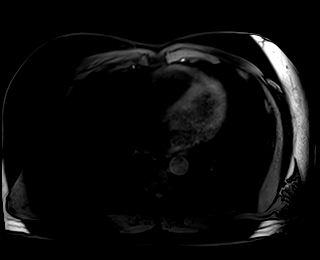

[Series 11: T1 dynamic post-contrast · axial · 3.0mm · 1.25mm/px · z∈[-183,+78]mm · 3 of 88 slices shown (1 of 9)]
[im 1/88]
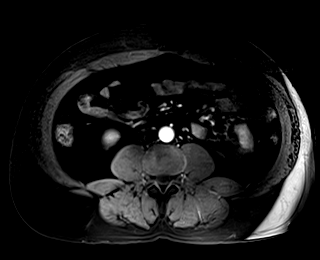
[im 44/88]
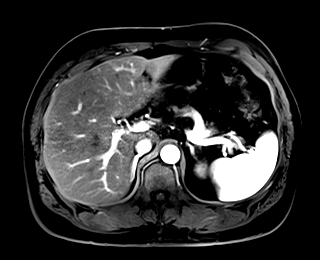
[im 88/88]
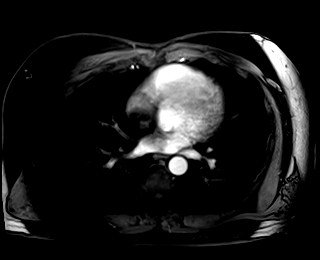

[Series 12: T1 dynamic post-contrast · axial · 3.0mm · 1.25mm/px · z∈[-183,+78]mm · 3 of 88 slices shown (2 of 9)]
[im 1/88]
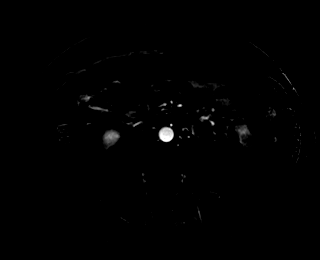
[im 44/88]
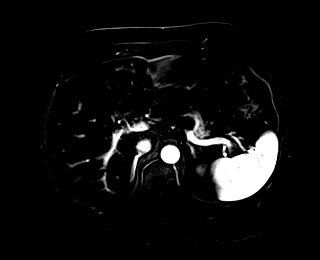
[im 88/88]
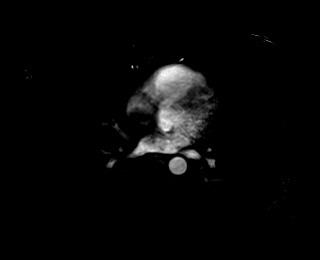

[Series 13: T1 dynamic post-contrast · axial · 3.0mm · 1.25mm/px · z∈[-183,+78]mm · 3 of 88 slices shown (3 of 9)]
[im 1/88]
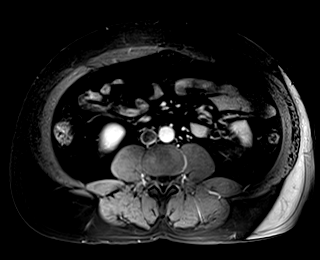
[im 44/88]
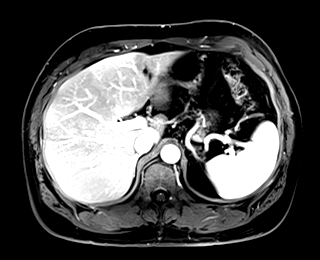
[im 88/88]
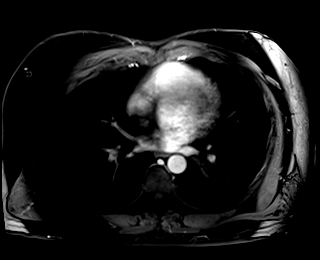

[Series 14: T1 dynamic post-contrast · axial · 3.0mm · 1.25mm/px · z∈[-183,+78]mm · 3 of 88 slices shown (4 of 9)]
[im 1/88]
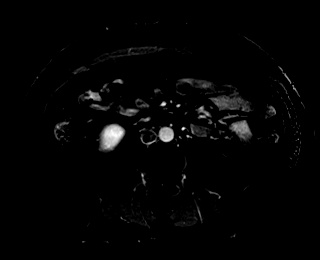
[im 44/88]
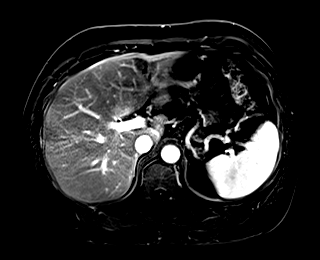
[im 88/88]
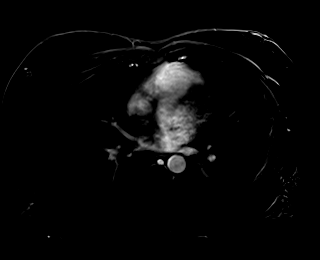

[Series 15: T1 dynamic post-contrast · axial · 3.0mm · 1.25mm/px · z∈[-183,+78]mm · 3 of 88 slices shown (5 of 9)]
[im 1/88]
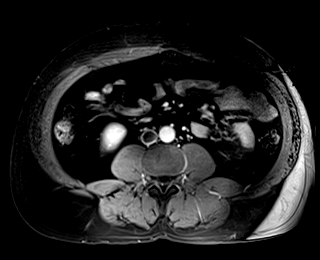
[im 44/88]
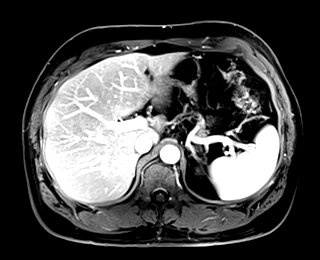
[im 88/88]
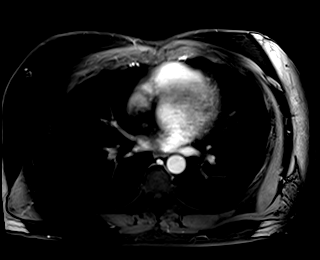

[Series 16: T1 dynamic post-contrast · axial · 3.0mm · 1.25mm/px · z∈[-183,+78]mm · 3 of 88 slices shown (6 of 9)]
[im 1/88]
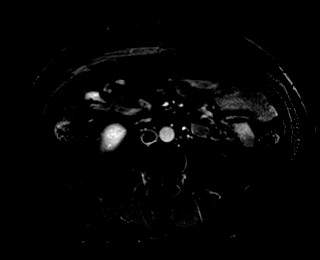
[im 44/88]
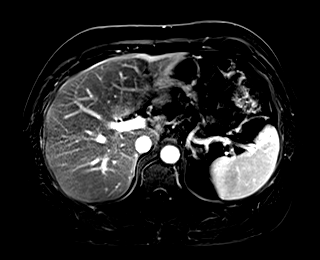
[im 88/88]
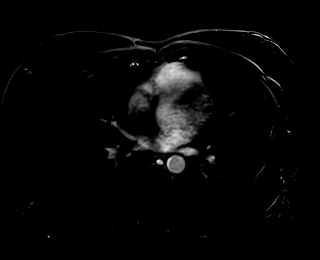

[Series 17: T1 dynamic post-contrast · coronal · 3.0mm · 1.25mm/px · 3 of 88 slices shown (7 of 9)]
[im 1/88]
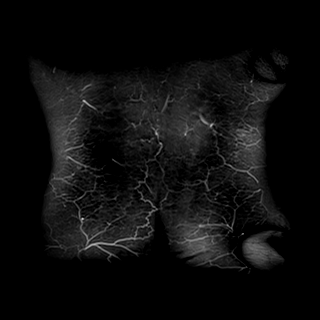
[im 44/88]
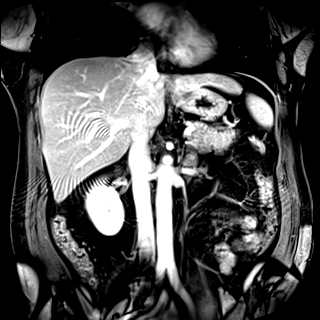
[im 88/88]
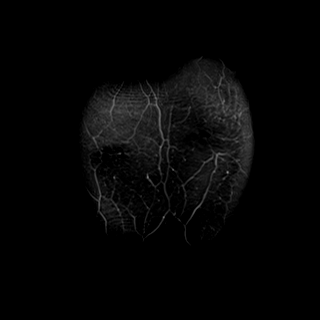

[Series 18: T1 dynamic post-contrast · axial · 3.0mm · 1.25mm/px · z∈[-183,+78]mm · 3 of 88 slices shown (8 of 9)]
[im 1/88]
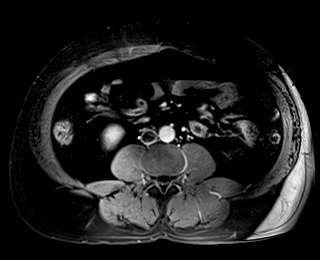
[im 44/88]
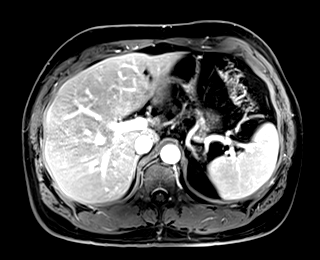
[im 88/88]
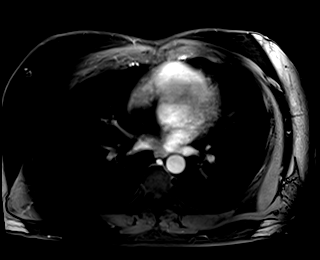

[Series 19: T1 dynamic post-contrast · axial · 3.0mm · 1.25mm/px · z∈[-183,+78]mm · 3 of 88 slices shown (9 of 9)]
[im 1/88]
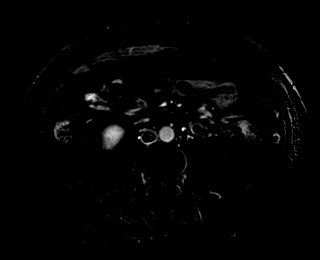
[im 44/88]
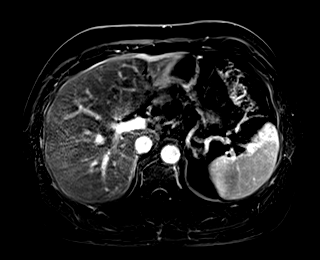
[im 88/88]
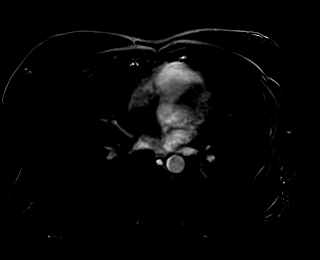

[48 of 48 positions shown; findings below may reference images not displayed]

FINDINGS: Lower chest: No acute findings.

Hepatobiliary: Moderate diffuse hepatic steatosis. No hepatic masses
identified. Gallbladder is unremarkable. No evidence of biliary
ductal dilatation.

Pancreas:  No mass or inflammatory changes.

Spleen:  Within normal limits in size and appearance.

Adrenals/Urinary Tract: No masses identified. No evidence of
hydronephrosis.

Stomach/Bowel: Visualized portions within the abdomen are
unremarkable.

Vascular/Lymphatic: No pathologically enlarged lymph nodes
identified. No abdominal aortic aneurysm.

Other:  None.

Musculoskeletal:  No suspicious bone lesions identified.
IMPRESSION: Moderate diffuse hepatic steatosis. No evidence of hepatic neoplasm
or other significant abnormality.

## 2018-09-26 IMAGING — US US ABDOMEN LIMITED
1 series · 14 of 25 positions shown · non-contrast
Comparison: None

CLINICAL DATA: Abnormal abdominal ultrasound question liver mass,
history of fatty liver

EXAM:
ULTRASOUND ABDOMEN LIMITED RIGHT UPPER QUADRANT

[Series 1: us abdomen limited · 0.21mm/px · 14 of 51 slices shown]
[im 1/51]
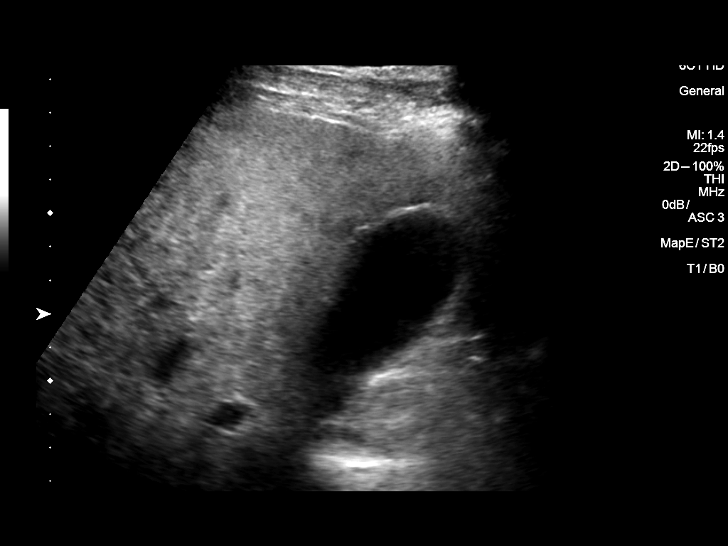
[im 5/51]
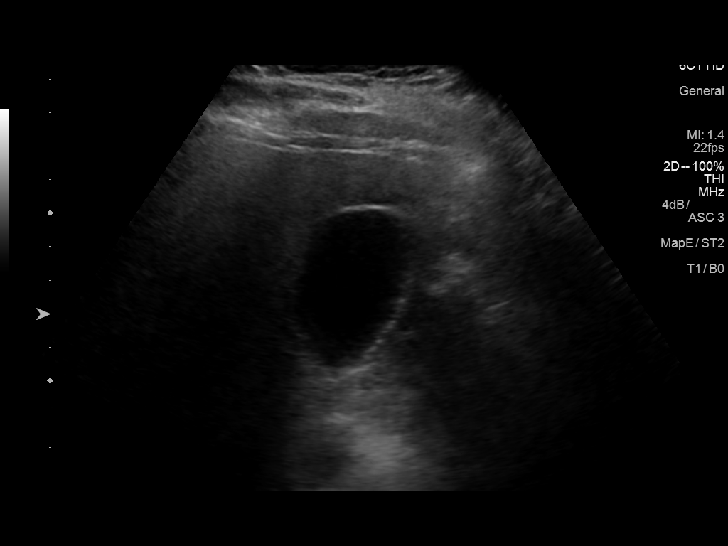
[im 9/51]
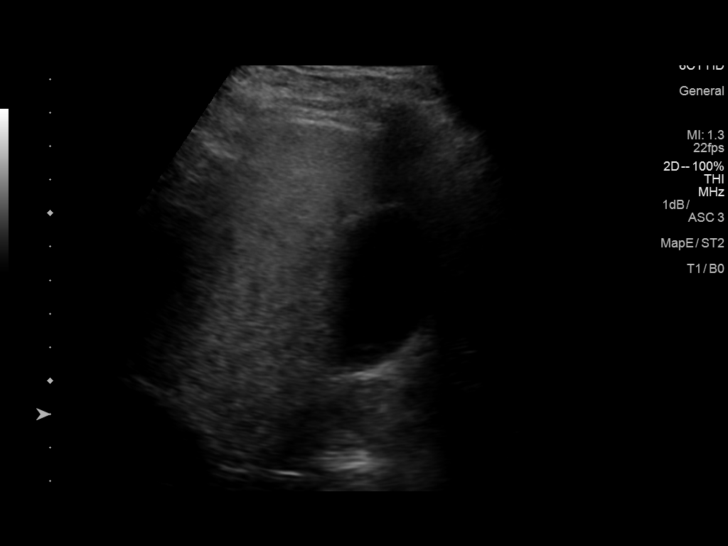
[im 13/51]
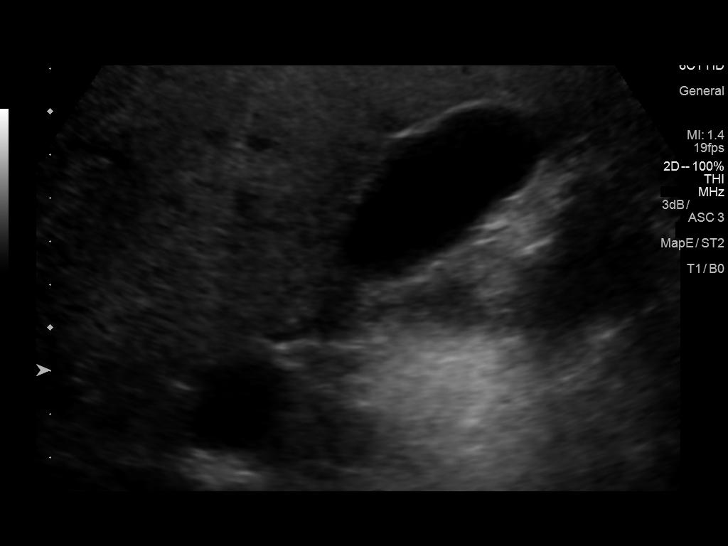
[im 17/51]
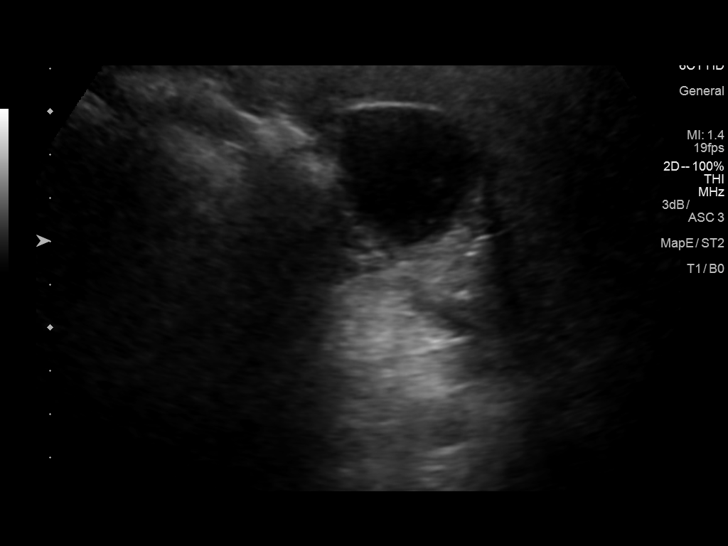
[im 19/51]
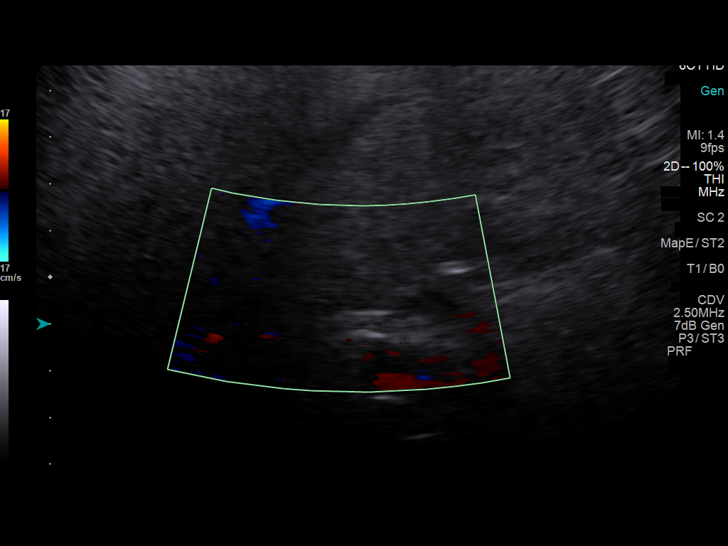
[im 23/51]
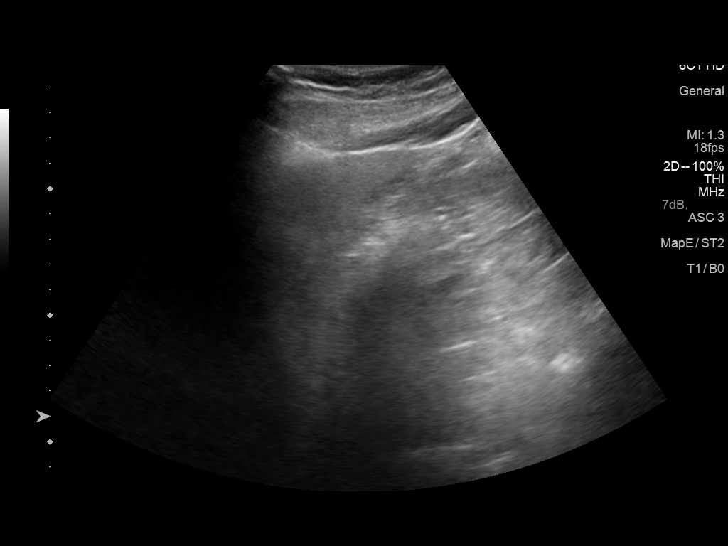
[im 28/51]
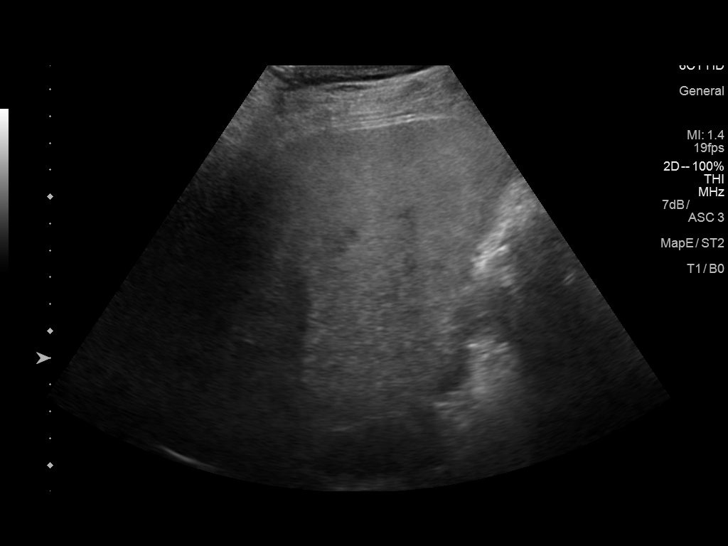
[im 32/51]
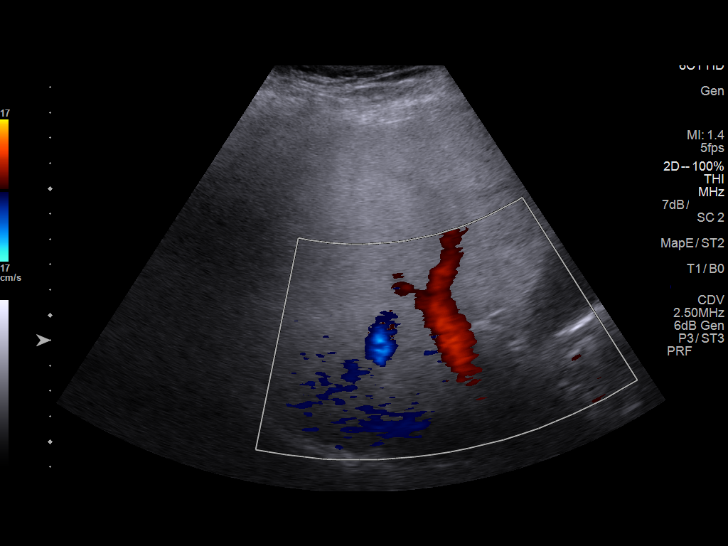
[im 34/51]
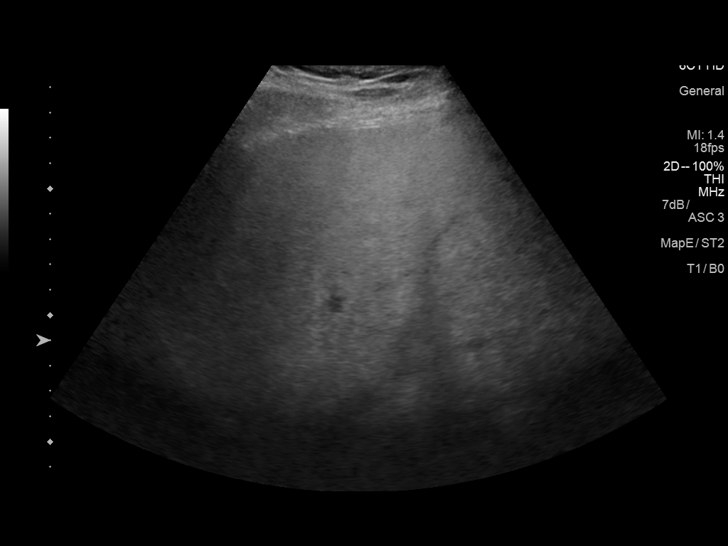
[im 38/51]
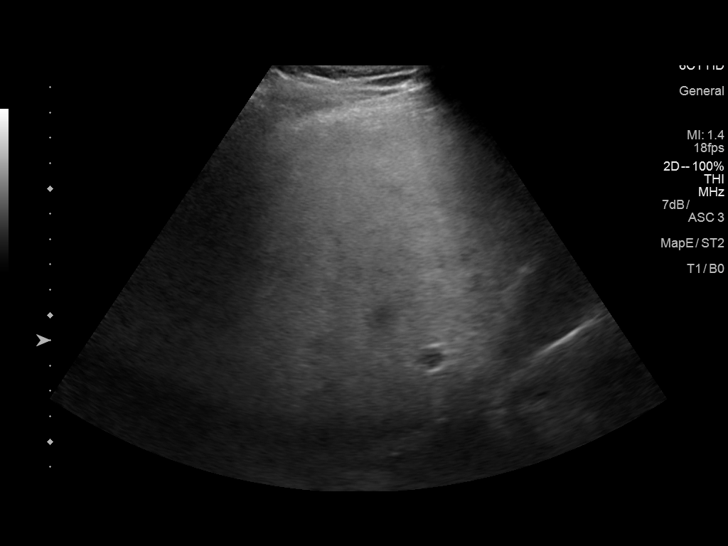
[im 42/51]
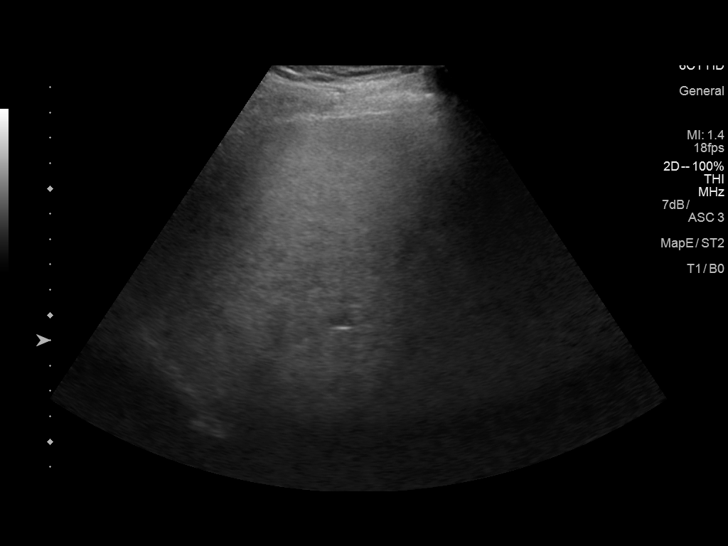
[im 46/51]
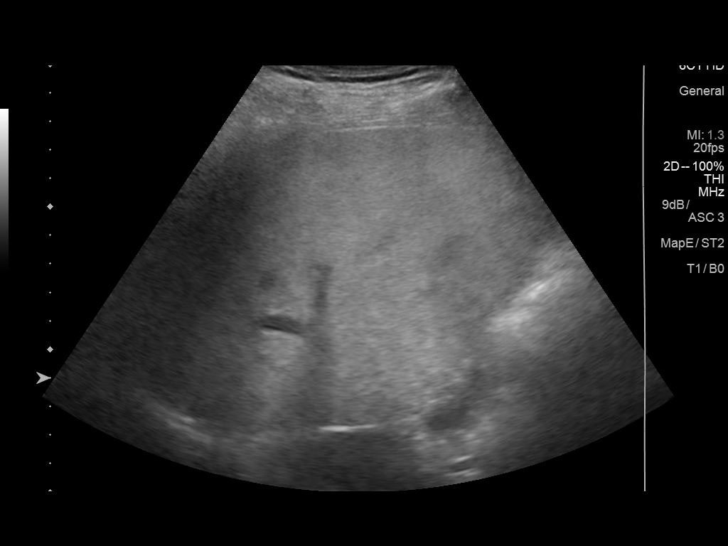
[im 51/51]
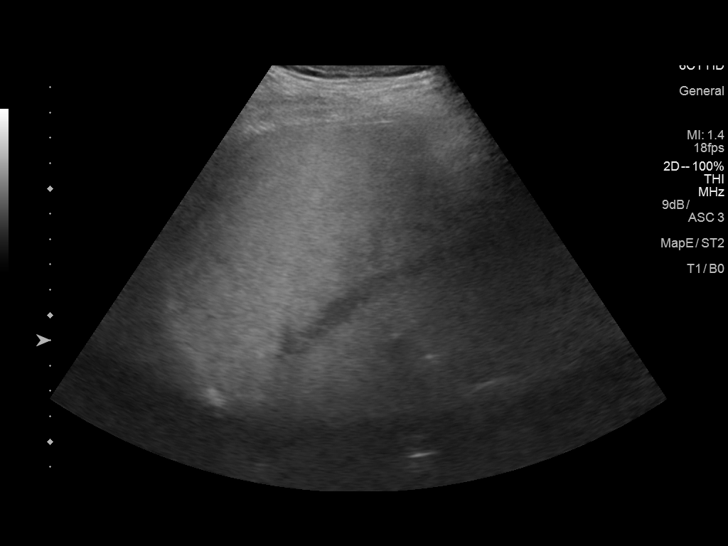

[14 of 25 positions shown; findings below may reference images not displayed]

FINDINGS: Gallbladder:

Normally distended without stones or wall thickening. No
pericholecystic fluid or sonographic Murphy sign.

Common bile duct:

Diameter: Normal caliber 4 mm diameter

Liver:

Echogenic parenchyma, likely fatty infiltration though this can be
seen with cirrhosis and certain infiltrative disorders. No focal
hepatic mass or nodularity. Hepatopetal portal venous flow.

No RIGHT upper quadrant free fluid.
IMPRESSION: Probable fatty infiltration of liver as above.

Otherwise negative exam.

## 2018-10-06 DIAGNOSIS — Z131 Encounter for screening for diabetes mellitus: Secondary | ICD-10-CM | POA: Diagnosis not present

## 2018-10-06 DIAGNOSIS — R072 Precordial pain: Secondary | ICD-10-CM | POA: Diagnosis not present

## 2018-10-06 DIAGNOSIS — E559 Vitamin D deficiency, unspecified: Secondary | ICD-10-CM | POA: Diagnosis not present

## 2018-10-06 DIAGNOSIS — R5383 Other fatigue: Secondary | ICD-10-CM | POA: Diagnosis not present

## 2018-10-06 DIAGNOSIS — R0602 Shortness of breath: Secondary | ICD-10-CM | POA: Diagnosis not present

## 2018-10-06 DIAGNOSIS — Z125 Encounter for screening for malignant neoplasm of prostate: Secondary | ICD-10-CM | POA: Diagnosis not present

## 2018-10-06 DIAGNOSIS — Z0000 Encounter for general adult medical examination without abnormal findings: Secondary | ICD-10-CM | POA: Diagnosis not present

## 2018-10-06 DIAGNOSIS — Z1159 Encounter for screening for other viral diseases: Secondary | ICD-10-CM | POA: Diagnosis not present

## 2018-10-21 DIAGNOSIS — R945 Abnormal results of liver function studies: Secondary | ICD-10-CM | POA: Diagnosis not present

## 2018-10-21 DIAGNOSIS — N529 Male erectile dysfunction, unspecified: Secondary | ICD-10-CM | POA: Diagnosis not present

## 2018-10-21 DIAGNOSIS — E7800 Pure hypercholesterolemia, unspecified: Secondary | ICD-10-CM | POA: Diagnosis not present
